# Patient Record
Sex: Female | Born: 1945 | Race: Black or African American | Hispanic: No | Marital: Single | Smoking: Former smoker
Health system: Southern US, Community
[De-identification: ages and names within clinical notes are randomized; demographics above are authoritative.]

## PROBLEM LIST (undated history)

## (undated) DIAGNOSIS — C801 Malignant (primary) neoplasm, unspecified: Secondary | ICD-10-CM

---

## 2015-04-14 ENCOUNTER — Other Ambulatory Visit: Payer: Self-pay

## 2015-04-14 ENCOUNTER — Inpatient Hospital Stay (HOSPITAL_COMMUNITY)
Admission: AD | Admit: 2015-04-14 | Discharge: 2015-04-22 | DRG: 853 | Disposition: A | Discharge status: Home Health | Payer: Commercial Managed Care - HMO | Source: Other Acute Inpatient Hospital | Attending: Internal Medicine | Admitting: Internal Medicine

## 2015-04-14 DIAGNOSIS — D490 Neoplasm of unspecified behavior of digestive system: Secondary | ICD-10-CM | POA: Diagnosis not present

## 2015-04-14 DIAGNOSIS — R296 Repeated falls: Secondary | ICD-10-CM | POA: Diagnosis not present

## 2015-04-14 DIAGNOSIS — R109 Unspecified abdominal pain: Secondary | ICD-10-CM | POA: Diagnosis present

## 2015-04-14 DIAGNOSIS — K831 Obstruction of bile duct: Secondary | ICD-10-CM | POA: Diagnosis present

## 2015-04-14 DIAGNOSIS — K921 Melena: Secondary | ICD-10-CM | POA: Diagnosis not present

## 2015-04-14 DIAGNOSIS — R64 Cachexia: Secondary | ICD-10-CM | POA: Diagnosis present

## 2015-04-14 DIAGNOSIS — E872 Acidosis: Secondary | ICD-10-CM | POA: Diagnosis not present

## 2015-04-14 DIAGNOSIS — I509 Heart failure, unspecified: Secondary | ICD-10-CM | POA: Diagnosis not present

## 2015-04-14 DIAGNOSIS — R17 Unspecified jaundice: Secondary | ICD-10-CM | POA: Insufficient documentation

## 2015-04-14 DIAGNOSIS — D62 Acute posthemorrhagic anemia: Secondary | ICD-10-CM | POA: Diagnosis present

## 2015-04-14 DIAGNOSIS — K922 Gastrointestinal hemorrhage, unspecified: Secondary | ICD-10-CM | POA: Diagnosis not present

## 2015-04-14 DIAGNOSIS — C179 Malignant neoplasm of small intestine, unspecified: Secondary | ICD-10-CM | POA: Diagnosis present

## 2015-04-14 DIAGNOSIS — Z6822 Body mass index (BMI) 22.0-22.9, adult: Secondary | ICD-10-CM | POA: Diagnosis not present

## 2015-04-14 DIAGNOSIS — E46 Unspecified protein-calorie malnutrition: Secondary | ICD-10-CM | POA: Diagnosis not present

## 2015-04-14 DIAGNOSIS — R1900 Intra-abdominal and pelvic swelling, mass and lump, unspecified site: Secondary | ICD-10-CM | POA: Diagnosis not present

## 2015-04-14 DIAGNOSIS — N179 Acute kidney failure, unspecified: Secondary | ICD-10-CM

## 2015-04-14 DIAGNOSIS — R6520 Severe sepsis without septic shock: Secondary | ICD-10-CM

## 2015-04-14 DIAGNOSIS — A419 Sepsis, unspecified organism: Principal | ICD-10-CM

## 2015-04-14 DIAGNOSIS — Z515 Encounter for palliative care: Secondary | ICD-10-CM | POA: Insufficient documentation

## 2015-04-14 DIAGNOSIS — Z66 Do not resuscitate: Secondary | ICD-10-CM | POA: Diagnosis not present

## 2015-04-14 DIAGNOSIS — K819 Cholecystitis, unspecified: Secondary | ICD-10-CM | POA: Diagnosis present

## 2015-04-14 DIAGNOSIS — R6521 Severe sepsis with septic shock: Secondary | ICD-10-CM | POA: Diagnosis present

## 2015-04-14 DIAGNOSIS — R531 Weakness: Secondary | ICD-10-CM | POA: Diagnosis not present

## 2015-04-14 DIAGNOSIS — D689 Coagulation defect, unspecified: Secondary | ICD-10-CM | POA: Diagnosis present

## 2015-04-14 DIAGNOSIS — N170 Acute kidney failure with tubular necrosis: Secondary | ICD-10-CM | POA: Diagnosis not present

## 2015-04-14 DIAGNOSIS — I471 Supraventricular tachycardia: Secondary | ICD-10-CM | POA: Diagnosis not present

## 2015-04-14 DIAGNOSIS — E876 Hypokalemia: Secondary | ICD-10-CM | POA: Diagnosis present

## 2015-04-14 DIAGNOSIS — K7290 Hepatic failure, unspecified without coma: Secondary | ICD-10-CM | POA: Diagnosis present

## 2015-04-14 DIAGNOSIS — T360X5A Adverse effect of penicillins, initial encounter: Secondary | ICD-10-CM | POA: Diagnosis not present

## 2015-04-14 DIAGNOSIS — N390 Urinary tract infection, site not specified: Secondary | ICD-10-CM | POA: Diagnosis not present

## 2015-04-14 DIAGNOSIS — J189 Pneumonia, unspecified organism: Secondary | ICD-10-CM

## 2015-04-14 DIAGNOSIS — C229 Malignant neoplasm of liver, not specified as primary or secondary: Secondary | ICD-10-CM | POA: Diagnosis not present

## 2015-04-14 DIAGNOSIS — C221 Intrahepatic bile duct carcinoma: Secondary | ICD-10-CM | POA: Insufficient documentation

## 2015-04-14 DIAGNOSIS — D684 Acquired coagulation factor deficiency: Secondary | ICD-10-CM | POA: Diagnosis not present

## 2015-04-14 NOTE — H&P (Signed)
PULMONARY / CRITICAL CARE MEDICINE   Name: Heather Harmon MRN: [id number] DOB: 1945-10-02    ADMISSION DATE:  04/14/2015 CONSULTATION DATE:  04/14/2015  REFERRING MD :  Oval Linsey EDP  CHIEF COMPLAINT:  Abd pain  INITIAL PRESENTATION: 69 year old female presented to [facility] ED c/o abdominal pain and yellow skin. 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 Also noted to be have AKI with SCr 8. Transferred to [facility] for further eval.  STUDIES:  CT abd/pelvis 7/20 > Significant intrahepatic and proximal extrahepatic biliary dilation and questionably obstructing mass lesion at the CBD concerning for cholangiocarcinoma. Contracted gallbladder containing high attenuation material. Enlarged perihepatic lymph node. US abdomen 7/20 > Results from [facility] not available at this time.  SIGNIFICANT EVENTS:  HISTORY OF PRESENT ILLNESS:  69 year old female with no known medical history reports "yellow eyes" for about one month now. About 3-4 days ago she began to have SOB with even minimal exertion which was progressively getting worse. 7/19 late PM she had several episodes of vomiting, and 7/20 she had weakness with multiple falls. She also had several loose stools that were noted to be bloody with clots by family. For these reasons she presented to the ER 7/20. Given her presentation she was sent for a CT scan of the abdomen and pelvis, the results of which are described above. 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 While in ED she developed sinus tachycardia with rates up to the 160s, which resolved with IV metoprolol. She had an abdominal ultrasound as well, however, those results are unavailable to me at this time. She was transferred to [facility] for further evaluation.   PAST MEDICAL HISTORY :   has no past medical history on file.  has no past  surgical history on file. Prior to Admission medications   Not on File   Allergies not on file  FAMILY HISTORY:  has no family status information on file.  SOCIAL HISTORY:    REVIEW OF SYSTEMS:  Bolds are positive  Constitutional: weight loss, gain, night sweats, Fevers, chills, fatigue .  HEENT: headaches, Sore throat, sneezing, nasal congestion, post nasal drip, Difficulty swallowing, Tooth/dental problems, visual complaints visual changes, ear ache CV:  chest pain, radiates: ,Orthopnea, PND, swelling in lower extremities, dizziness, palpitations, syncope.  GI  heartburn, indigestion, abdominal pain, nausea, vomiting, diarrhea, change in bowel habits, loss of appetite, bloody stools.  Resp: cough, productive: , hemoptysis, dyspnea, chest pain, pleuritic.  Skin: rash or itching or icterus GU: dysuria, change in color of urine, urgency or frequency. flank pain, hematuria  MS: joint pain or swelling. decreased range of motion  Psych: change in mood or affect. depression or anxiety.  Neuro: difficulty with speech, weakness, numbness, ataxia    SUBJECTIVE:   VITAL SIGNS: Pulse Rate:  [154] 154 (07/20 2300) Resp:  [20] 20 (07/20 2300) BP: (99)/(71) 99/71 mmHg (07/20 2300) SpO2:  [98 %] 98 % (07/20 2300) HEMODYNAMICS:   VENTILATOR SETTINGS:   INTAKE / OUTPUT: No intake or output data in the 24 hours ending 04/14/15 2331  PHYSICAL EXAMINATION: General:  Thin female in NAD Neuro:  Alert, oriented, non-focal HEENT:  [place]/AT, jaundiced sclerae, PERRL, no JVD Cardiovascular:  Tachy, regular, no MRG noted Lungs:  Diminished bases Abdomen: Soft, non-tender, non-distended Musculoskeletal:  No acute deformity or ROM limitation, no Edema Skin: Grossly intact, jaundiced.   LABS:  CBC No results  for input(s): WBC, HGB, HCT, PLT in the last 168 hours. Coag's No results for input(s): APTT, INR in the last 168 hours. BMET No results for input(s): NA, K, CL, CO2, BUN, CREATININE,  GLUCOSE in the last 168 hours. Electrolytes No results for input(s): CALCIUM, MG, PHOS in the last 168 hours. Sepsis Markers No results for input(s): LATICACIDVEN, PROCALCITON, O2SATVEN in the last 168 hours. ABG No results for input(s): PHART, PCO2ART, PO2ART in the last 168 hours. Liver Enzymes No results for input(s): AST, ALT, ALKPHOS, BILITOT, ALBUMIN in the last 168 hours. Cardiac Enzymes No results for input(s): TROPONINI, PROBNP in the last 168 hours. Glucose No results for input(s): GLUCAP in the last 168 hours.  Imaging No results found.   ASSESSMENT / PLAN:  PULMONARY A: Dyspnea ? 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 RLL PNA  P:   BCx2 7/21 >>> UC  7/21 >>>  Abx: pip/tazo, start date 7/21 >>> Add azithro if CAP on CXR PCT  ENDOCRINE A:   No acute issues  P:   Follow glucose on chemistry  NEUROLOGIC A:   No acute issues  P:   Monitor   FAMILY  - Updates:   - Inter-disciplinary family meet or Palliative Care meeting due by:  04/15/2015   Georgann Housekeeper,  AGACNP-BC [place] [facility] Pager [phone] or [phone]  04/15/2015 12:27 AM

## 2015-04-15 ENCOUNTER — Inpatient Hospital Stay (HOSPITAL_COMMUNITY): Payer: Commercial Managed Care - HMO

## 2015-04-15 ENCOUNTER — Encounter (HOSPITAL_COMMUNITY): Payer: Self-pay | Admitting: Critical Care Medicine

## 2015-04-15 DIAGNOSIS — N179 Acute kidney failure, unspecified: Secondary | ICD-10-CM

## 2015-04-15 DIAGNOSIS — K831 Obstruction of bile duct: Secondary | ICD-10-CM

## 2015-04-15 DIAGNOSIS — R6520 Severe sepsis without septic shock: Secondary | ICD-10-CM

## 2015-04-15 DIAGNOSIS — E876 Hypokalemia: Secondary | ICD-10-CM | POA: Diagnosis present

## 2015-04-15 DIAGNOSIS — N390 Urinary tract infection, site not specified: Secondary | ICD-10-CM

## 2015-04-15 DIAGNOSIS — R1900 Intra-abdominal and pelvic swelling, mass and lump, unspecified site: Secondary | ICD-10-CM

## 2015-04-15 DIAGNOSIS — A419 Sepsis, unspecified organism: Secondary | ICD-10-CM

## 2015-04-15 LAB — CBC
HCT: 19.6 % — ABNORMAL LOW (ref 36.0–46.0)
HCT: 22.7 % — ABNORMAL LOW (ref 36.0–46.0)
HEMATOCRIT: 25.7 % — AB (ref 36.0–46.0)
HEMOGLOBIN: 8.5 g/dL — AB (ref 12.0–15.0)
HEMOGLOBIN: 6.6 g/dL — AB (ref 12.0–15.0)
Hemoglobin: 7.8 g/dL — ABNORMAL LOW (ref 12.0–15.0)
MCH: 27.2 pg (ref 26.0–34.0)
MCH: 28.3 pg (ref 26.0–34.0)
MCH: 29.9 pg (ref 26.0–34.0)
MCHC: 33.1 g/dL (ref 30.0–36.0)
MCHC: 33.7 g/dL (ref 30.0–36.0)
MCHC: 34.4 g/dL (ref 30.0–36.0)
MCV: 82.4 fL (ref 78.0–100.0)
MCV: 84.1 fL (ref 78.0–100.0)
MCV: 87 fL (ref 78.0–100.0)
PLATELETS: 337 10*3/uL (ref 150–400)
Platelets: 288 10*3/uL (ref 150–400)
Platelets: 274 10*3/uL (ref 150–400)
RBC: 3.12 MIL/uL — AB (ref 3.87–5.11)
RBC: 2.33 MIL/uL — ABNORMAL LOW (ref 3.87–5.11)
RBC: 2.61 MIL/uL — AB (ref 3.87–5.11)
RDW: 17.4 % — ABNORMAL HIGH (ref 11.5–15.5)
RDW: 17.1 % — ABNORMAL HIGH (ref 11.5–15.5)
RDW: 16.9 % — ABNORMAL HIGH (ref 11.5–15.5)
WBC: 17.6 10*3/uL — ABNORMAL HIGH (ref 4.0–10.5)
WBC: 19.3 10*3/uL — ABNORMAL HIGH (ref 4.0–10.5)
WBC: 16.5 10*3/uL — ABNORMAL HIGH (ref 4.0–10.5)

## 2015-04-15 LAB — URINALYSIS, ROUTINE W REFLEX MICROSCOPIC
Glucose, UA: NEGATIVE mg/dL
Ketones, ur: NEGATIVE mg/dL
NITRITE: NEGATIVE
Protein, ur: NEGATIVE mg/dL
Specific Gravity, Urine: 1.006 (ref 1.005–1.030)
UROBILINOGEN UA: 0.2 mg/dL (ref 0.0–1.0)
pH: 6 (ref 5.0–8.0)

## 2015-04-15 LAB — COMPREHENSIVE METABOLIC PANEL
ALBUMIN: 1.7 g/dL — AB (ref 3.5–5.0)
ALT: 79 U/L — AB (ref 14–54)
AST: 114 U/L — ABNORMAL HIGH (ref 15–41)
Alkaline Phosphatase: 682 U/L — ABNORMAL HIGH (ref 38–126)
Anion gap: 17 — ABNORMAL HIGH (ref 5–15)
BUN: 100 mg/dL — AB (ref 6–20)
CO2: 11 mmol/L — ABNORMAL LOW (ref 22–32)
CREATININE: 7.44 mg/dL — AB (ref 0.44–1.00)
Calcium: 7.8 mg/dL — ABNORMAL LOW (ref 8.9–10.3)
Chloride: 102 mmol/L (ref 101–111)
GFR calc Af Amer: 6 mL/min — ABNORMAL LOW (ref >60)
GFR calc non Af Amer: 5 mL/min — ABNORMAL LOW (ref >60)
GLUCOSE: 99 mg/dL (ref 65–99)
Potassium: 3.2 mmol/L — ABNORMAL LOW (ref 3.5–5.1)
SODIUM: 130 mmol/L — AB (ref 135–145)
TOTAL PROTEIN: 5.3 g/dL — AB (ref 6.5–8.1)
Total Bilirubin: 40.8 mg/dL (ref 0.3–1.2)

## 2015-04-15 LAB — TROPONIN I
TROPONIN I: 0.1 ng/mL — AB (ref <0.031)
TROPONIN I: 0.05 ng/mL — AB (ref <0.031)
Troponin I: 0.05 ng/mL — ABNORMAL HIGH (ref <0.031)

## 2015-04-15 LAB — LACTIC ACID, PLASMA: LACTIC ACID, VENOUS: 1 mmol/L (ref 0.5–2.0)

## 2015-04-15 LAB — MAGNESIUM
MAGNESIUM: 1.6 mg/dL — AB (ref 1.7–2.4)
Magnesium: 1.2 mg/dL — ABNORMAL LOW (ref 1.7–2.4)

## 2015-04-15 LAB — PHOSPHORUS
Phosphorus: 7.1 mg/dL — ABNORMAL HIGH (ref 2.5–4.6)
Phosphorus: 6.4 mg/dL — ABNORMAL HIGH (ref 2.5–4.6)

## 2015-04-15 LAB — BASIC METABOLIC PANEL
ANION GAP: 19 — AB (ref 5–15)
Anion gap: 14 (ref 5–15)
BUN: 94 mg/dL — AB (ref 6–20)
BUN: 82 mg/dL — AB (ref 6–20)
CALCIUM: 8.1 mg/dL — AB (ref 8.9–10.3)
CHLORIDE: 109 mmol/L (ref 101–111)
CO2: 12 mmol/L — ABNORMAL LOW (ref 22–32)
CO2: 11 mmol/L — ABNORMAL LOW (ref 22–32)
Calcium: 7.7 mg/dL — ABNORMAL LOW (ref 8.9–10.3)
Chloride: 108 mmol/L (ref 101–111)
Creatinine, Ser: 6.32 mg/dL — ABNORMAL HIGH (ref 0.44–1.00)
Creatinine, Ser: 6.24 mg/dL — ABNORMAL HIGH (ref 0.44–1.00)
GFR calc Af Amer: 7 mL/min — ABNORMAL LOW (ref >60)
GFR calc non Af Amer: 6 mL/min — ABNORMAL LOW (ref >60)
GFR calc non Af Amer: 6 mL/min — ABNORMAL LOW (ref >60)
GFR, EST AFRICAN AMERICAN: 7 mL/min — AB (ref >60)
GLUCOSE: 86 mg/dL (ref 65–99)
Glucose, Bld: 85 mg/dL (ref 65–99)
Potassium: 3.2 mmol/L — ABNORMAL LOW (ref 3.5–5.1)
Potassium: 2.8 mmol/L — ABNORMAL LOW (ref 3.5–5.1)
SODIUM: 135 mmol/L (ref 135–145)
Sodium: 138 mmol/L (ref 135–145)

## 2015-04-15 LAB — PREPARE RBC (CROSSMATCH)

## 2015-04-15 LAB — FIBRINOGEN: FIBRINOGEN: 508 mg/dL — AB (ref 204–475)

## 2015-04-15 LAB — PROTIME-INR
INR: 5.78 (ref 0.00–1.49)
INR: 1.23 (ref 0.00–1.49)
Prothrombin Time: 50.1 seconds — ABNORMAL HIGH (ref 11.6–15.2)
Prothrombin Time: 15.6 seconds — ABNORMAL HIGH (ref 11.6–15.2)

## 2015-04-15 LAB — LIPASE, BLOOD: Lipase: 18 U/L — ABNORMAL LOW (ref 22–51)

## 2015-04-15 LAB — URINE MICROSCOPIC-ADD ON

## 2015-04-15 LAB — MRSA PCR SCREENING: MRSA by PCR: NEGATIVE

## 2015-04-15 LAB — BRAIN NATRIURETIC PEPTIDE: B Natriuretic Peptide: 121.2 pg/mL — ABNORMAL HIGH (ref 0.0–100.0)

## 2015-04-15 LAB — APTT: aPTT: 26 seconds (ref 24–37)

## 2015-04-15 LAB — ABO/RH: ABO/RH(D): B POS

## 2015-04-15 LAB — AMYLASE: AMYLASE: 27 U/L — AB (ref 28–100)

## 2015-04-15 MED ORDER — POTASSIUM CHLORIDE 10 MEQ/100ML IV SOLN
10.0000 meq | INTRAVENOUS | Status: AC
  Administered 2015-04-15 (×4): 10 meq via INTRAVENOUS
  Filled 2015-04-15 (×4): qty 100

## 2015-04-15 MED ORDER — SODIUM CHLORIDE 0.9 % IV SOLN: Freq: Once | INTRAVENOUS | Status: DC

## 2015-04-15 MED ORDER — METOPROLOL TARTRATE 1 MG/ML IV SOLN: 2.5000 mg | INTRAVENOUS | Status: DC | PRN

## 2015-04-15 MED ORDER — SODIUM CHLORIDE 0.9 % IV SOLN
INTRAVENOUS | Status: DC
  Administered 2015-04-15: 100 mL via INTRAVENOUS

## 2015-04-15 MED ORDER — POTASSIUM CHLORIDE 10 MEQ/100ML IV SOLN
10.0000 meq | INTRAVENOUS | Status: DC
  Filled 2015-04-15: qty 100

## 2015-04-15 MED ORDER — METOPROLOL TARTRATE 1 MG/ML IV SOLN
2.5000 mg | INTRAVENOUS | Status: DC | PRN
  Administered 2015-04-15 (×2): 2.5 mg via INTRAVENOUS
  Filled 2015-04-15 (×2): qty 5

## 2015-04-15 MED ORDER — SODIUM CHLORIDE 0.9 % IV SOLN: 250.0000 mL | INTRAVENOUS | Status: DC | PRN

## 2015-04-15 MED ORDER — POTASSIUM CHLORIDE 10 MEQ/50ML IV SOLN
10.0000 meq | INTRAVENOUS | Status: DC
Start: 2015-04-15 — End: 2015-04-15
  Administered 2015-04-15 (×3): 10 meq via INTRAVENOUS

## 2015-04-15 MED ORDER — FAMOTIDINE IN NACL 20-0.9 MG/50ML-% IV SOLN: 20.0000 mg | INTRAVENOUS | Status: DC

## 2015-04-15 MED ORDER — PIPERACILLIN-TAZOBACTAM IN DEX 2-0.25 GM/50ML IV SOLN
2.2500 g | Freq: Once | INTRAVENOUS | Status: AC
  Administered 2015-04-15: 2.25 g via INTRAVENOUS
  Filled 2015-04-15: qty 50

## 2015-04-15 MED ORDER — FAMOTIDINE IN NACL 20-0.9 MG/50ML-% IV SOLN
20.0000 mg | Freq: Two times a day (BID) | INTRAVENOUS | Status: DC
  Administered 2015-04-15: 20 mg via INTRAVENOUS
  Filled 2015-04-15: qty 50

## 2015-04-15 MED ORDER — MAGNESIUM SULFATE IN D5W 10-5 MG/ML-% IV SOLN
1.0000 g | Freq: Once | INTRAVENOUS | Status: AC
  Administered 2015-04-15: 1 g via INTRAVENOUS
  Filled 2015-04-15: qty 100

## 2015-04-15 MED ORDER — PANTOPRAZOLE SODIUM 40 MG IV SOLR
40.0000 mg | Freq: Two times a day (BID) | INTRAVENOUS | Status: DC
  Administered 2015-04-15 – 2015-04-16 (×4): 40 mg via INTRAVENOUS
  Filled 2015-04-15 (×7): qty 40

## 2015-04-15 MED ORDER — SODIUM BICARBONATE 8.4 % IV SOLN
INTRAVENOUS | Status: DC
  Administered 2015-04-15: 12:00:00 via INTRAVENOUS
  Filled 2015-04-15 (×6): qty 150

## 2015-04-15 MED ORDER — VITAMIN K1 10 MG/ML IJ SOLN
10.0000 mg | Freq: Once | INTRAVENOUS | Status: AC
  Administered 2015-04-15: 10 mg via INTRAVENOUS
  Filled 2015-04-15: qty 1

## 2015-04-15 MED ORDER — PIPERACILLIN-TAZOBACTAM IN DEX 2-0.25 GM/50ML IV SOLN
2.2500 g | Freq: Three times a day (TID) | INTRAVENOUS | Status: DC
  Administered 2015-04-15 – 2015-04-17 (×7): 2.25 g via INTRAVENOUS
  Filled 2015-04-15 (×8): qty 50

## 2015-04-15 MED ORDER — SODIUM CHLORIDE 0.9 % IV BOLUS (SEPSIS)
750.0000 mL | Freq: Once | INTRAVENOUS | Status: AC
  Administered 2015-04-15 (×2): 750 mL via INTRAVENOUS

## 2015-04-15 NOTE — Progress Notes (Signed)
CRITICAL VALUE ALERT  Critical value received:  Total Bilirubin 40.8, INR 5.78   Date of notification:  7-21  Time of notification:  0300  Critical value read back: yes  Nurse who received alert:  Patricia Nettle  MD notified (1st page):  MD Joya Gaskins  Time of first page:  0340  MD notified (2nd page):  Time of second page:  Responding MD:  MD Joya Gaskins  Time MD responded:  [phone]

## 2015-04-15 NOTE — Progress Notes (Signed)
[place] Progress Note Patient Name: Heather Harmon DOB: 1946/06/27 MRN: [id number]   Date of Service  04/15/2015  HPI/Events of Note  Ongoing tachycardia with HR in the 140s.  Has received 2 doses of 2.5 mg of lopressor IV without change.  eICU Interventions  Dosing of lopressor changed to 2.5 to 5 mg to see is a higher dose will control HR     Intervention Category Intermediate Interventions: Arrhythmia - evaluation and management  Hyden Soley 04/15/2015, 11:46 PM

## 2015-04-15 NOTE — Progress Notes (Signed)
[place] Progress Note Patient Name: Heather Harmon DOB: 25-May-1946 MRN: [id number]   Date of Service  04/15/2015  HPI/Events of Note  Patient w/ persistent tachycardia into 140s while awake. Sinus tach on EKG. Patient denies any palpitations, SOB, or chest discomfort. BP stable. RN reports HR <100 when sleeping.  eICU Interventions  Spoke with RN at bedside. Continue IV Lopressor prn.     Intervention Category Minor Interventions: Clinical assessment - ordering diagnostic tests  Tera Partridge 04/15/2015, 7:07 PM

## 2015-04-15 NOTE — Consult Note (Signed)
Reason for Consult: AKI Referring Physician: PCCM  HPI: Heather Harmon is a 69 yo female with no diagnosed PMHx and not on any home medications. Patient has had a one month history of yellowing of her eyes. In the last 3-4 days, patient began to develop shortness of breath with exertion, nausea with vomting, weakness and began having multiple falls at home. Family reports patient started having bloody stools and she was brought to [facility] ED. There, patient complained mainly of abdominal pain and yellowing of skin and eyes. She was found to be tachycardic with leukocytosis, anemic with hemoglobin of 6.6, coagulopathic with INR of 5.7 and prothrombin time 50 and elevated liver enzymes. CT abdomen/pelvis revealed a contracted gallbladder and a mass on CBD concerning for cholangiocarcinoma, possibly [place] tumor. Patient was transferred to [facility] for further evaluation and treatment. Patient was started on Zosyn, protonix, vitamin K, FFP, and transfused with pRBCs.   Nephrology was consulted for likely acute on chronic kidney disease. We do not have a known baseline. SCr 7.44>6.32. GFR of 5-7.   Patient was seen and examined. She states she feels well and denies any complaints.   History reviewed. No pertinent past medical history.  History reviewed. No pertinent past surgical history.  History reviewed. No pertinent family history.  Social History:  has no tobacco, alcohol, and drug history on file.  Allergies: No Known Allergies   Results for orders placed or performed during the hospital encounter of 04/14/15 (from the past 48 hour(s))  MRSA PCR Screening     Status: None   Collection Time: 04/14/15 10:56 PM  Result Value Ref Range   MRSA by PCR NEGATIVE NEGATIVE    Comment:        The GeneXpert MRSA Assay (FDA approved for NASAL specimens only), is one component of a comprehensive MRSA colonization surveillance program. It is not intended to diagnose MRSA infection nor to guide  or monitor treatment for MRSA infections.   Urinalysis, Routine w reflex microscopic (not at [facility])     Status: Abnormal   Collection Time: 04/15/15 12:48 AM  Result Value Ref Range   Color, Urine AMBER (A) YELLOW    Comment: BIOCHEMICALS MAY BE AFFECTED BY COLOR   APPearance CLOUDY (A) CLEAR   Specific Gravity, Urine 1.006 1.005 - 1.030   pH 6.0 5.0 - 8.0   Glucose, UA NEGATIVE NEGATIVE mg/dL   Hgb urine dipstick MODERATE (A) NEGATIVE   Bilirubin Urine LARGE (A) NEGATIVE   Ketones, ur NEGATIVE NEGATIVE mg/dL   Protein, ur NEGATIVE NEGATIVE mg/dL   Urobilinogen, UA 0.2 0.0 - 1.0 mg/dL   Nitrite NEGATIVE NEGATIVE   Leukocytes, UA SMALL (A) NEGATIVE  Urine microscopic-add on     Status: None   Collection Time: 04/15/15 12:48 AM  Result Value Ref Range   Squamous Epithelial / LPF RARE RARE   WBC, UA 3-6 <3 WBC/hpf   RBC / HPF 3-6 <3 RBC/hpf   Bacteria, UA RARE RARE  Comprehensive metabolic panel     Status: Abnormal   Collection Time: 04/15/15  1:26 AM  Result Value Ref Range   Sodium 130 (L) 135 - 145 mmol/L   Potassium 3.2 (L) 3.5 - 5.1 mmol/L   Chloride 102 101 - 111 mmol/L   CO2 11 (L) 22 - 32 mmol/L   Glucose, Bld 99 65 - 99 mg/dL   BUN 100 (H) 6 - 20 mg/dL   Creatinine, Ser 7.44 (H) 0.44 - 1.00 mg/dL   Calcium 7.8 (  L) 8.9 - 10.3 mg/dL   Total Protein 5.3 (L) 6.5 - 8.1 g/dL   Albumin 1.7 (L) 3.5 - 5.0 g/dL   AST 114 (H) 15 - 41 U/L   ALT 79 (H) 14 - 54 U/L   Alkaline Phosphatase 682 (H) 38 - 126 U/L   Total Bilirubin 40.8 (HH) 0.3 - 1.2 mg/dL    Comment: REPEATED TO VERIFY RESULTS CONFIRMED BY MANUAL DILUTION CRITICAL RESULT CALLED TO, READ BACK BY AND VERIFIED WITH: SMITH F,RN 04/15/15 0311 WAYK    GFR calc non Af Amer 5 (L) >60 mL/min   GFR calc Af Amer 6 (L) >60 mL/min    Comment: (NOTE) The eGFR has been calculated using the CKD EPI equation. This calculation has not been validated in all clinical situations. eGFR's persistently <60 mL/min signify possible  Chronic Kidney Disease.    Anion gap 17 (H) 5 - 15  Magnesium     Status: Abnormal   Collection Time: 04/15/15  1:26 AM  Result Value Ref Range   Magnesium 1.2 (L) 1.7 - 2.4 mg/dL  Phosphorus     Status: Abnormal   Collection Time: 04/15/15  1:26 AM  Result Value Ref Range   Phosphorus 7.1 (H) 2.5 - 4.6 mg/dL  Amylase     Status: Abnormal   Collection Time: 04/15/15  1:26 AM  Result Value Ref Range   Amylase 27 (L) 28 - 100 U/L  Lipase, blood     Status: Abnormal   Collection Time: 04/15/15  1:26 AM  Result Value Ref Range   Lipase 18 (L) 22 - 51 U/L  Troponin I     Status: Abnormal   Collection Time: 04/15/15  1:26 AM  Result Value Ref Range   Troponin I 0.10 (H) <0.031 ng/mL    Comment:        PERSISTENTLY INCREASED TROPONIN VALUES IN THE RANGE OF 0.04-0.49 ng/mL CAN BE SEEN IN:       -UNSTABLE ANGINA       -CONGESTIVE HEART FAILURE       -MYOCARDITIS       -CHEST TRAUMA       -ARRYHTHMIAS       -LATE PRESENTING MYOCARDIAL INFARCTION       -COPD   CLINICAL FOLLOW-UP RECOMMENDED.   Lactic acid, plasma     Status: None   Collection Time: 04/15/15  1:26 AM  Result Value Ref Range   Lactic Acid, Venous 1.0 0.5 - 2.0 mmol/L  Procalcitonin     Status: None   Collection Time: 04/15/15  1:26 AM  Result Value Ref Range   Procalcitonin 5.65 ng/mL    Comment:        Interpretation: PCT > 2 ng/mL: Systemic infection (sepsis) is likely, unless other causes are known. (NOTE)         ICU PCT Algorithm               Non ICU PCT Algorithm    ----------------------------     ------------------------------         PCT < 0.25 ng/mL                 PCT < 0.1 ng/mL     Stopping of antibiotics            Stopping of antibiotics       strongly encouraged.               strongly encouraged.    ----------------------------     ------------------------------  PCT level decrease by               PCT < 0.25 ng/mL       >= 80% from peak PCT       OR PCT 0.25 - 0.5 ng/mL           Stopping of antibiotics                                             encouraged.     Stopping of antibiotics           encouraged.    ----------------------------     ------------------------------       PCT level decrease by              PCT >= 0.25 ng/mL       < 80% from peak PCT        AND PCT >= 0.5 ng/mL            Continuing antibiotics                                               encouraged.       Continuing antibiotics            encouraged.    ----------------------------     ------------------------------     PCT level increase compared          PCT > 0.5 ng/mL         with peak PCT AND          PCT >= 0.5 ng/mL             Escalation of antibiotics                                          strongly encouraged.      Escalation of antibiotics        strongly encouraged.   Brain natriuretic peptide     Status: Abnormal   Collection Time: 04/15/15  1:26 AM  Result Value Ref Range   B Natriuretic Peptide 121.2 (H) 0.0 - 100.0 pg/mL  CBC     Status: Abnormal   Collection Time: 04/15/15  1:26 AM  Result Value Ref Range   WBC 17.6 (H) 4.0 - 10.5 K/uL   RBC 3.12 (L) 3.87 - 5.11 MIL/uL   Hemoglobin 8.5 (L) 12.0 - 15.0 g/dL   HCT 25.7 (L) 36.0 - 46.0 %   MCV 82.4 78.0 - 100.0 fL   MCH 27.2 26.0 - 34.0 pg   MCHC 33.1 30.0 - 36.0 g/dL   RDW 17.4 (H) 11.5 - 15.5 %   Platelets 337 150 - 400 K/uL  Protime-INR     Status: Abnormal   Collection Time: 04/15/15  1:26 AM  Result Value Ref Range   Prothrombin Time 50.1 (H) 11.6 - 15.2 seconds    Comment: REPEATED TO VERIFY   INR 5.78 (HH) 0.00 - 1.49    Comment: REPEATED TO VERIFY CRITICAL RESULT CALLED TO, READ BACK BY AND VERIFIED WITH: S LOVE,RN 0240 04/15/15 D BRADLEY   Type and screen  Status: None (Preliminary result)   Collection Time: 04/15/15  1:26 AM  Result Value Ref Range   ABO/RH(D) B POS    Antibody Screen NEG    Sample Expiration 04/18/2015    Unit Number [id number]    Blood Component Type RED CELLS,LR     Unit division 00    Status of Unit ISSUED    Transfusion Status OK TO TRANSFUSE    Crossmatch Result Compatible    Unit Number [id number]    Blood Component Type RED CELLS,LR    Unit division 00    Status of Unit ALLOCATED    Transfusion Status OK TO TRANSFUSE    Crossmatch Result Compatible   ABO/Rh     Status: None   Collection Time: 04/15/15  1:26 AM  Result Value Ref Range   ABO/RH(D) B POS   Prepare fresh frozen plasma     Status: None (Preliminary result)   Collection Time: 04/15/15  1:58 AM  Result Value Ref Range   Unit Number [id number]    Blood Component Type THAWED PLASMA    Unit division 00    Status of Unit ISSUED    Transfusion Status OK TO TRANSFUSE    Unit Number [id number]    Blood Component Type THAWED PLASMA    Unit division 00    Status of Unit ISSUED    Transfusion Status OK TO TRANSFUSE   Troponin I     Status: Abnormal   Collection Time: 04/15/15  7:50 AM  Result Value Ref Range   Troponin I 0.05 (H) <0.031 ng/mL    Comment:        PERSISTENTLY INCREASED TROPONIN VALUES IN THE RANGE OF 0.04-0.49 ng/mL CAN BE SEEN IN:       -UNSTABLE ANGINA       -CONGESTIVE HEART FAILURE       -MYOCARDITIS       -CHEST TRAUMA       -ARRYHTHMIAS       -LATE PRESENTING MYOCARDIAL INFARCTION       -COPD   CLINICAL FOLLOW-UP RECOMMENDED.   CBC     Status: Abnormal   Collection Time: 04/15/15  7:50 AM  Result Value Ref Range   WBC 19.3 (H) 4.0 - 10.5 K/uL   RBC 2.33 (L) 3.87 - 5.11 MIL/uL   Hemoglobin 6.6 (LL) 12.0 - 15.0 g/dL    Comment: REPEATED TO VERIFY CRITICAL RESULT CALLED TO, READ BACK BY AND VERIFIED WITH: T PARKER,RN AT 0263 04/15/15 BY K BARR    HCT 19.6 (L) 36.0 - 46.0 %   MCV 84.1 78.0 - 100.0 fL   MCH 28.3 26.0 - 34.0 pg   MCHC 33.7 30.0 - 36.0 g/dL   RDW 17.1 (H) 11.5 - 15.5 %   Platelets 288 150 - 400 K/uL  Basic metabolic panel     Status: Abnormal   Collection Time: 04/15/15  7:50 AM  Result Value Ref Range   Sodium 135 135 -  145 mmol/L   Potassium 3.2 (L) 3.5 - 5.1 mmol/L   Chloride 109 101 - 111 mmol/L   CO2 12 (L) 22 - 32 mmol/L   Glucose, Bld 86 65 - 99 mg/dL   BUN 94 (H) 6 - 20 mg/dL   Creatinine, Ser 6.32 (H) 0.44 - 1.00 mg/dL   Calcium 7.7 (L) 8.9 - 10.3 mg/dL   GFR calc non Af Amer 6 (L) >60 mL/min   GFR calc Af Amer 7 (L) >60 mL/min  Comment: (NOTE) The eGFR has been calculated using the CKD EPI equation. This calculation has not been validated in all clinical situations. eGFR's persistently <60 mL/min signify possible Chronic Kidney Disease.    Anion gap 14 5 - 15  Magnesium     Status: Abnormal   Collection Time: 04/15/15  7:50 AM  Result Value Ref Range   Magnesium 1.6 (L) 1.7 - 2.4 mg/dL  Phosphorus     Status: Abnormal   Collection Time: 04/15/15  7:50 AM  Result Value Ref Range   Phosphorus 6.4 (H) 2.5 - 4.6 mg/dL  Prepare fresh frozen plasma     Status: None (Preliminary result)   Collection Time: 04/15/15  8:02 AM  Result Value Ref Range   Unit Number [id number]    Blood Component Type THAWED PLASMA    Unit division 00    Status of Unit ISSUED    Transfusion Status OK TO TRANSFUSE    Unit Number [id number]    Blood Component Type THAWED PLASMA    Unit division 00    Status of Unit ISSUED    Transfusion Status OK TO TRANSFUSE    Unit Number [id number]    Blood Component Type THAWED PLASMA    Unit division 00    Status of Unit ISSUED    Transfusion Status OK TO TRANSFUSE    Unit Number [id number]    Blood Component Type THAWED PLASMA    Unit division 00    Status of Unit ISSUED    Transfusion Status OK TO TRANSFUSE   Prepare RBC     Status: None   Collection Time: 04/15/15  9:37 AM  Result Value Ref Range   Order Confirmation ORDER PROCESSED BY BLOOD BANK   Troponin I     Status: Abnormal   Collection Time: 04/15/15  1:30 PM  Result Value Ref Range   Troponin I 0.05 (H) <0.031 ng/mL    Comment:        PERSISTENTLY INCREASED TROPONIN VALUES IN THE RANGE  OF 0.04-0.49 ng/mL CAN BE SEEN IN:       -UNSTABLE ANGINA       -CONGESTIVE HEART FAILURE       -MYOCARDITIS       -CHEST TRAUMA       -ARRYHTHMIAS       -LATE PRESENTING MYOCARDIAL INFARCTION       -COPD   CLINICAL FOLLOW-UP RECOMMENDED.   Protime-INR Once     Status: Abnormal   Collection Time: 04/15/15  1:30 PM  Result Value Ref Range   Prothrombin Time 15.6 (H) 11.6 - 15.2 seconds   INR 1.23 0.00 - 1.49    Dg Chest Port 1 View  04/15/2015   CLINICAL DATA:  Acute onset of generalized shortness of breath and weakness. Initial encounter.  EXAM: PORTABLE CHEST - 1 VIEW  COMPARISON:  None.  FINDINGS: The lungs are well-aerated. Minimal right basilar density is thought to reflect overlying soft tissues. There is no evidence of focal opacification, pleural effusion or pneumothorax.  The cardiomediastinal silhouette is within normal limits. No acute osseous abnormalities are seen.  IMPRESSION: No acute cardiopulmonary process seen.   Electronically Signed   By: Garald Balding M.D.   On: 04/15/2015 01:51   ROS  General: Denies fever, chills Respiratory: Denies SOB, cough Cardiovascular: Denies chest pain and palpitations.  Gastrointestinal: Admits to nausea, vomiting, abdominal pain Genitourinary: Denies dysuria, urgency.  Skin: Admits to yellowing  Neurological: Denies dizziness, headaches, weakness, lightheadedness  Physical Exam Filed Vitals:   04/15/15 1435 04/15/15 1500 04/15/15 1530 04/15/15 1535  BP:  101/70 102/57 99/61  Pulse:   87 89  Temp:    98.1 F (36.7 C)  TempSrc:    Oral  Resp: 19 20 22 20   Height:      Weight:      SpO2:  100% 100% 96%   General: Vital signs reviewed.  Patient is chronically ill-appearing, in no acute distress and cooperative with exam.  Eyes: +Scleral icterus Cardiovascular: Tachycardic, regular rhythm, S1 normal, S2 normal Pulmonary/Chest: Clear to auscultation bilaterally, no wheezes, rales, or rhonchi. Abdominal: Soft, non-tender,  non-distended, BS +, no guarding present.  Extremities: No lower extremity edema bilaterally Neurological: A&O Skin: Jaundice. Warm, dry and intact.   Assessment/Plan:  Acute Kidney Injury: Likely secondary to Acute Tubular Necrosis due to hypoperfusion related to septic shock and liver disease. Differential diagnosis includes hepatorenal syndrome, but since patient has already begun to improve, this diagnosis is less likely. We do not have a known baseline GFR. Patient does not follow with a physician and may have underlying chronic disease. SCr 7.44>6.32. GFR of 6-7. There is no indication for CRRT at this time. We will treat with fluids, bicarb and monitor UOP for now.  -Trend CMET -Monitor UOP -Place foley -Sodium Bicarbonate 150 mEq in D5W at 75 cc/hr  Hypokalemia: Potassium 3.2. Likely will increase with administration of blood products. May decrease in setting of bicarb. Monitor for now. -Repeat CMET tomorrow am  Hyperphosphatemia: Phosphorus 7.1>6.4. -Monitor  Normocytic Anemia in setting of ?UGIB: Family reports bloody stools. Hgb 8.5>6.6, partially dilutional effect. Unknown baseline. Patient may have had erosion of the tumor into into surround vessels causing bleed.  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 Patient with elevated LFTs (alk phos 682, AST 114, ALT 79, T. Bili 40.8. GI and Surgery following. GI will schedule for EGD tomorrow with biopsy and possible ERCP with stent placement. IR may need to see if unable to place stent. Surgery has also seen the patient for possible surgery. Depending on location of tumor, patient may need to be transferred to another facility.  -Protonix -Zosyn -PCCM, GI and Surgery following  Coagulopathy: Likely from underlying intrinsic disease. Prothrombin time 50.1, INR 5.78. Patient received FFP x 4 and Vitamin K.  -Vitamin  K -Monitor -Repeat CMET tomorrow am  -Check HIV, Hepatitis B surface antibody and antigen, hepatitis c antibody  [name], DO PGY-2 Internal Medicine Resident Pager # [phone] 04/15/2015 3:44 PM

## 2015-04-15 NOTE — Progress Notes (Signed)
eLink Physician-Brief Progress Note Patient Name: Heather Harmon DOB: 1945-09-28 MRN: [id number]   Date of Service  04/15/2015  HPI/Events of Note  RN notified that K on BMP was 2.8.   eICU Interventions  IVK     Intervention Category Intermediate Interventions: Electrolyte abnormality - evaluation and management  Tera Partridge 04/15/2015, 7:42 PM

## 2015-04-15 NOTE — Progress Notes (Signed)
[place] Progress Note Patient Name: Heather Harmon DOB: [birth year] MRN: [id number]   Date of Service  04/15/2015  HPI/Events of Note  Septic shock state  eICU Interventions  Give ns bolus, chk lactate, may need cvl     Intervention Category Major Interventions: Shock - evaluation and management;Sepsis - evaluation and management  Asencion Noble 04/15/2015, 1:34 AM

## 2015-04-15 NOTE — Consult Note (Signed)
[facility] Consult: 9:08 AM 04/15/2015  LOS: 1 day    Referring Provider: Dr Elsworth Soho  Primary Care Physician:  Marco Collie, MD: family says pt has not been to see an MD in >40 years Primary Gastroenterologist:  unassigned  Sister is Alveda Reasons [phone]   Reason for Consultation:  Jaundice, CBD mass.    HPI: Heather Harmon is a 69 y.o. female.  No previous hx.  Never sees an MD.  No regular meds.    Scleral icterus for about 1 month along with anorexia.  4 days of progressive SOB. Vomiting (non bloody) started 7/19. Several falls and passing clotted, dark bloody stool x 1 on 7/20. No BM since.   Transfer from [facility] with jaundice, t bil 40.  CT there with contracted GB with high attenuation material, dilated biliary tree, ? 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  Coagulopathic at 50/5.7.  Received FFPand vitamin K.  Empiric Zosyn and IV pepcid initiated.   She is also anemic with Hgb of 6.6, normal MCV. Hypokalemic, AKI.       History reviewed. No pertinent past medical history.  History reviewed. No pertinent past surgical history.  Prior to Admission medications   Not on File    Scheduled Meds: . sodium chloride   Intravenous Once  . sodium chloride   Intravenous Once  . famotidine (PEPCID) IV  20 mg Intravenous Q12H  . piperacillin-tazobactam (ZOSYN)  IV  2.25 g Intravenous Q8H   Infusions: . sodium chloride 100 mL (04/15/15 0154)   PRN Meds: sodium chloride, metoprolol   Allergies as of 04/14/2015  . (Not on File)    History reviewed. No pertinent family history.  History   Social History  . Marital Status: Single    Spouse Name: N/A  . Number of Children: N/A  . Years of Education: N/A   Occupational History  . Not on file.    Social History Main Topics  . Smoking status: Not on file  . Smokeless tobacco: Not on file  . Alcohol Use: Not on file  . Drug Use: Not on file  . Sexual Activity: Not on file   Other Topics Concern  . Not on file   Social History Narrative  . No narrative on file    REVIEW OF SYSTEMS: Constitutional:  Pt denies but her sister endorses weight loss.   ENT:  No nose bleeds Pulm:  + DOE CV:  No palpitations, no LE edema.  GU:  No hematuria, no frequency.  Urine amber colored GI:  No dysphagia, no abd pain.  Daily BMs.  She denies previous dark stools Heme:  No previous issues with bleeding or low blood counts   Transfusions:  None in past Neuro:  No headaches, no peripheral tingling or numbness, no syncope.  Sister says pt has not had problems with her memory.  Derm:  No itching, no rash or sores.  Endocrine:  No sweats or chills.  No polyuria or dysuria Immunization:  None.  Travel:  None beyond  local counties in last few months.    PHYSICAL EXAM: Vital signs in last 24 hours: Filed Vitals:   04/15/15 0600  BP: 97/54  Pulse: 89  Temp: 98.1 F (36.7 C)  Resp: 18   Wt Readings from Last 3 Encounters:  04/14/15 133 lb 2.5 oz (60.4 kg)    General: thin, icteric, comfortable.  Head:  No swelling or asymmetry.  Temporal wasting.   Eyes:  +icterus, + conj pallor Ears:  Not HOH  Nose:  No discharge or congestion Mouth:  Clear and moist. edentulous Neck:  No mass or JVD.  No TMG Lungs:  Clear bil.  Diminished at bases.  No dyspnea or cough Heart: tachy, regular into 150s Abdomen:  Soft, no mass, no HSM, no bruits.  NT.  Active BS.   Rectal: melenic stool, no mass   Musc/Skeltl: no joint swelling or contracture Extremities:  No CCEl  Neurologic:  Oriented x 3.   Appropriate.  Her hx contradicts that given by her sister.  Moves all 4s, no gross deficits.  Skin:  No sores, rash Tattoos:  none Nodes:  No cervical adenopathy   Psych:  Pleasant, quiet, relaxed.    Intake/Output from previous day: 07/20 0701 - 07/21 0700 In: 1145 [I.V.:500; Blood:645] Out: 800 [Urine:800] Intake/Output this shift:    LAB RESULTS:  Recent Labs  04/15/15 0126 04/15/15 0750  WBC 17.6* 19.3*  HGB 8.5* 6.6*  HCT 25.7* 19.6*  PLT 337 288   BMET Lab Results  Component Value Date   NA 135 04/15/2015   NA 130* 04/15/2015   K 3.2* 04/15/2015   K 3.2* 04/15/2015   CL 109 04/15/2015   CL 102 04/15/2015   CO2 12* 04/15/2015   CO2 11* 04/15/2015   GLUCOSE 86 04/15/2015   GLUCOSE 99 04/15/2015   BUN 94* 04/15/2015   BUN 100* 04/15/2015   CREATININE 6.32* 04/15/2015   CREATININE 7.44* 04/15/2015   CALCIUM 7.7* 04/15/2015   CALCIUM 7.8* 04/15/2015   LFT  Recent Labs  04/15/15 0126  PROT 5.3*  ALBUMIN 1.7*  AST 114*  ALT 79*  ALKPHOS 682*  BILITOT 40.8*   PT/INR Lab Results  Component Value Date   INR 5.78* 04/15/2015   Hepatitis Panel No results for input(s): HEPBSAG, HCVAB, HEPAIGM, HEPBIGM in the last 72 hours. C-Diff No components found for: CDIFF Lipase     Component Value Date/Time   LIPASE 18* 04/15/2015 0126    Drugs of Abuse  No results found for: LABOPIA, COCAINSCRNUR, LABBENZ, AMPHETMU, THCU, LABBARB   RADIOLOGY STUDIES: Dg Chest Port 1 View  04/15/2015   CLINICAL DATA:  Acute onset of generalized shortness of breath and weakness. Initial encounter.  EXAM: PORTABLE CHEST - 1 VIEW  COMPARISON:  None.  FINDINGS: The lungs are well-aerated. Minimal right basilar density is thought to reflect overlying soft tissues. There is no evidence of focal opacification, pleural effusion or pneumothorax.  The cardiomediastinal silhouette is within normal limits. No acute osseous abnormalities are seen.  IMPRESSION: No acute cardiopulmonary process seen.   Electronically Signed   By: Garald Balding M.D.   On: 04/15/2015 01:51    ENDOSCOPIC STUDIES: None ever  IMPRESSION:   *  Klastskin tumor with obstructive jaundice.  *  UGIB.  ?  Erosion of tumor into SB?   *  Normocytic anemia.  Some of the drop may be due to volume resuscitation. 1 unit PRBC to transfuse today  *  Coagulopathy.  S/p 4  FFP and vitamin K  *  AKI.  Some improvement with IVF.      PLAN:     *  Will switch to Protonix.  *  EGD set for 10 AM tomorrow, if coags corrected sufficiently.  It is possible we can get tissue biopsy at this point.  Also if there is obstruction to the degree that biliary ampulla can not be reached, then we will not need to pursue ERCP, but in place of ERCP: send her for percutaneous procedure.   *  Surgeons have said that in setting of Klatskin's tumor, if surgery is indicated, it would need to be preformed at a tertiary facility.    Azucena Freed  04/15/2015, 9:08 AM Pager: [phone] Attending MD note:   I have taken a history, examined the patient, and reviewed the chart. I agree with the Advanced Practitioner's impression and recommendations.I have reviewed the CT scan s  With Dr Reid-,radiologist  obstructing mass extrinsic to CBD, obstructing the CBD in mid portion, could be ampullary, or pancreatic  tumor or a primary duodenal carcinoma. Duodenal C-loop is narrow, and pt is having a GIB , possible erosion of the tumor into duodenum. Massive distention of intrahepatic and extrahepatic ducts. Will endoscope tomorrow  To see if ERCP/stent possible ( we cannot get time for ERCP tomorrow), and if duodenal c-loop obstructed, she may need a percutaneous drainage and eventually internalization of the stent in IR. Agree with Vit K and FFP. Recheck Protime in am.  Ca19-9, CEA  [facility] Pager # [phone]

## 2015-04-15 NOTE — Progress Notes (Signed)
[facility] Progress Note Patient Name: Heather Harmon DOB: 09-Apr-1946 MRN: [id number]   Date of Service  04/15/2015  HPI/Events of Note  Notified of post-transfusion labs by RN. INR 1.2 & Hgb >7.0. Still having episodes of tachycardia.  eICU Interventions  CBC & INR AM 7/22. Checking EKG. Has Lopressor ordered.     Intervention Category Minor Interventions: Communication with other healthcare providers and/or family  Tera Partridge 04/15/2015, 6:27 PM

## 2015-04-15 NOTE — Progress Notes (Signed)
[facility] Progress Note Patient Name: Heather Harmon DOB: 03/13/1946 MRN: [id number]   Date of Service  04/15/2015  HPI/Events of Note  Lactic acid 1.0.  K low  Mg Low  eICU Interventions  K and Mg supp     Intervention Category Major Interventions: Electrolyte abnormality - evaluation and management;Acid-Base disturbance - evaluation and management  Asencion Noble 04/15/2015, 3:32 AM

## 2015-04-15 NOTE — Progress Notes (Signed)
ANTIBIOTIC CONSULT NOTE - INITIAL  Pharmacy Consult for Zosyn Indication: UTI and cholecystitis   69yo female tx'd to ICU from [facility] where she presented c/o yellow skin/eyes and progressively worsening SOB w/ minimal exertion, CT shows significant intrahepatic and proximal extrahepatic biliary dilation and questionably obstructing mass lesion at the CBD concerning for cholangiocarcinoma as well as contracted gallbladder containing high attenuation material.  Pt w/ AKI, may need HD.  Will begin Zosyn 2.25g IV Q8H and monitor CBC, Cx, CrCl.  Wynona Neat, PharmD, BCPS  04/15/2015,12:37 AM

## 2015-04-15 NOTE — Consult Note (Signed)
Heather Harmon August 14, 1946  [id number].   Primary Care MD: Dr. Marco Collie Requesting MD: Dr. Domingo Mend Chief Complaint/Reason for Consult: obstructed CBD HPI: This is a very sweet 69 yo black female who states she has no PMH.  She is not confused, but seems to be a poor historian.  Everything she tells me is different from the H&P HPI.  She states her eyes have been yellow for a couple of weeks, not a month.  She denies any abdominal pain whatsoever.  She denies nausea, vomiting, blood in her stool, change in stool color, or clay colored stools.  She states she went to [facility] where she had a CT scan, unclear why she went to [facility] in the first place since she denies all symptoms, but nonetheless, had a CT scan that revealed intra and extrahepatic ductal dilatation with a mass obstructing her CBD.  She went into SVT there and was noted to be septic with hypotension and WBC of 23K.  She was in renal failure with a cr of 7 and INR of 5 from likely intrinsic liver disease as she takes no meds at home.  She was then transferred to [facility] to the critical care service.  We have been asked to evaluate her for further recommendations.  ROS : Please see HPI, otherwise she denies any other symptoms to me  History reviewed. No pertinent family history.  History reviewed. No pertinent past medical history.  History reviewed. No pertinent past surgical history.  Social History:  has no tobacco, alcohol, and drug history on file.  Allergies: No Known Allergies  No prescriptions prior to admission    Blood pressure 91/56, pulse 94, temperature 97.8 F (36.6 C), temperature source Oral, resp. rate 25, height _0  (1.651 m), weight 60.4 kg (133 lb 2.5 oz), SpO2 91 %. Physical Exam: General: pleasant, WD, WN black female who is laying in bed in NAD HEENT: head is normocephalic, atraumatic.  Sclera are very icteric.  PERRL.  Ears and nose without any masses or lesions.  Mouth is pink but  dry Heart: regular, rate, and rhythm.  Normal s1,s2. No obvious gallops, or rubs noted, but may have a murmur.  Palpable radial and pedal pulses bilaterally Lungs: CTAB, no wheezes, rhonchi, or rales noted.  Respiratory effort nonlabored Abd: soft, NT, ND, +BS, no masses, hernias, or organomegaly MS: all 4 extremities are symmetrical with no cyanosis, clubbing, or edema. Skin: warm and dry with no masses, lesions, or rashes Psych: A&Ox3 with an appropriate affect.    Results for orders placed or performed during the hospital encounter of 04/14/15 (from the past 48 hour(s))  MRSA PCR Screening     Status: None   Collection Time: 04/14/15 10:56 PM  Result Value Ref Range   MRSA by PCR NEGATIVE NEGATIVE    Comment:        The GeneXpert MRSA Assay (FDA approved for NASAL specimens only), is one component of a comprehensive MRSA colonization surveillance program. It is not intended to diagnose MRSA infection nor to guide or monitor treatment for MRSA infections.   Urinalysis, Routine w reflex microscopic (not at [facility])     Status: Abnormal   Collection Time: 04/15/15 12:48 AM  Result Value Ref Range   Color, Urine AMBER (A) YELLOW    Comment: BIOCHEMICALS MAY BE AFFECTED BY COLOR   APPearance CLOUDY (A) CLEAR   Specific Gravity, Urine 1.006 1.005 - 1.030   pH 6.0 5.0 - 8.0  Glucose, UA NEGATIVE NEGATIVE mg/dL   Hgb urine dipstick MODERATE (A) NEGATIVE   Bilirubin Urine LARGE (A) NEGATIVE   Ketones, ur NEGATIVE NEGATIVE mg/dL   Protein, ur NEGATIVE NEGATIVE mg/dL   Urobilinogen, UA 0.2 0.0 - 1.0 mg/dL   Nitrite NEGATIVE NEGATIVE   Leukocytes, UA SMALL (A) NEGATIVE  Urine microscopic-add on     Status: None   Collection Time: 04/15/15 12:48 AM  Result Value Ref Range   Squamous Epithelial / LPF RARE RARE   WBC, UA 3-6 <3 WBC/hpf   RBC / HPF 3-6 <3 RBC/hpf   Bacteria, UA RARE RARE  Comprehensive metabolic panel     Status: Abnormal   Collection Time: 04/15/15  1:26 AM   Result Value Ref Range   Sodium 130 (L) 135 - 145 mmol/L   Potassium 3.2 (L) 3.5 - 5.1 mmol/L   Chloride 102 101 - 111 mmol/L   CO2 11 (L) 22 - 32 mmol/L   Glucose, Bld 99 65 - 99 mg/dL   BUN 100 (H) 6 - 20 mg/dL   Creatinine, Ser 7.44 (H) 0.44 - 1.00 mg/dL   Calcium 7.8 (L) 8.9 - 10.3 mg/dL   Total Protein 5.3 (L) 6.5 - 8.1 g/dL   Albumin 1.7 (L) 3.5 - 5.0 g/dL   AST 114 (H) 15 - 41 U/L   ALT 79 (H) 14 - 54 U/L   Alkaline Phosphatase 682 (H) 38 - 126 U/L   Total Bilirubin 40.8 (HH) 0.3 - 1.2 mg/dL    Comment: REPEATED TO VERIFY RESULTS CONFIRMED BY MANUAL DILUTION CRITICAL RESULT CALLED TO, READ BACK BY AND VERIFIED WITH: SMITH F,RN 04/15/15 0311 WAYK    GFR calc non Af Amer 5 (L) >60 mL/min   GFR calc Af Amer 6 (L) >60 mL/min    Comment: (NOTE) The eGFR has been calculated using the CKD EPI equation. This calculation has not been validated in all clinical situations. eGFR's persistently <60 mL/min signify possible Chronic Kidney Disease.    Anion gap 17 (H) 5 - 15  Magnesium     Status: Abnormal   Collection Time: 04/15/15  1:26 AM  Result Value Ref Range   Magnesium 1.2 (L) 1.7 - 2.4 mg/dL  Phosphorus     Status: Abnormal   Collection Time: 04/15/15  1:26 AM  Result Value Ref Range   Phosphorus 7.1 (H) 2.5 - 4.6 mg/dL  Amylase     Status: Abnormal   Collection Time: 04/15/15  1:26 AM  Result Value Ref Range   Amylase 27 (L) 28 - 100 U/L  Lipase, blood     Status: Abnormal   Collection Time: 04/15/15  1:26 AM  Result Value Ref Range   Lipase 18 (L) 22 - 51 U/L  Troponin I     Status: Abnormal   Collection Time: 04/15/15  1:26 AM  Result Value Ref Range   Troponin I 0.10 (H) <0.031 ng/mL    Comment:        PERSISTENTLY INCREASED TROPONIN VALUES IN THE RANGE OF 0.04-0.49 ng/mL CAN BE SEEN IN:       -UNSTABLE ANGINA       -CONGESTIVE HEART FAILURE       -MYOCARDITIS       -CHEST TRAUMA       -ARRYHTHMIAS       -LATE PRESENTING MYOCARDIAL INFARCTION        -COPD   CLINICAL FOLLOW-UP RECOMMENDED.   Lactic acid, plasma     Status:  None   Collection Time: 04/15/15  1:26 AM  Result Value Ref Range   Lactic Acid, Venous 1.0 0.5 - 2.0 mmol/L  Procalcitonin     Status: None   Collection Time: 04/15/15  1:26 AM  Result Value Ref Range   Procalcitonin 5.65 ng/mL    Comment:        Interpretation: PCT > 2 ng/mL: Systemic infection (sepsis) is likely, unless other causes are known. (NOTE)         ICU PCT Algorithm               Non ICU PCT Algorithm    ----------------------------     ------------------------------         PCT < 0.25 ng/mL                 PCT < 0.1 ng/mL     Stopping of antibiotics            Stopping of antibiotics       strongly encouraged.               strongly encouraged.    ----------------------------     ------------------------------       PCT level decrease by               PCT < 0.25 ng/mL       >= 80% from peak PCT       OR PCT 0.25 - 0.5 ng/mL          Stopping of antibiotics                                             encouraged.     Stopping of antibiotics           encouraged.    ----------------------------     ------------------------------       PCT level decrease by              PCT >= 0.25 ng/mL       < 80% from peak PCT        AND PCT >= 0.5 ng/mL            Continuing antibiotics                                               encouraged.       Continuing antibiotics            encouraged.    ----------------------------     ------------------------------     PCT level increase compared          PCT > 0.5 ng/mL         with peak PCT AND          PCT >= 0.5 ng/mL             Escalation of antibiotics                                          strongly encouraged.      Escalation of antibiotics        strongly encouraged.   Brain natriuretic  peptide     Status: Abnormal   Collection Time: 04/15/15  1:26 AM  Result Value Ref Range   B Natriuretic Peptide 121.2 (H) 0.0 - 100.0 pg/mL  CBC     Status:  Abnormal   Collection Time: 04/15/15  1:26 AM  Result Value Ref Range   WBC 17.6 (H) 4.0 - 10.5 K/uL   RBC 3.12 (L) 3.87 - 5.11 MIL/uL   Hemoglobin 8.5 (L) 12.0 - 15.0 g/dL   HCT 25.7 (L) 36.0 - 46.0 %   MCV 82.4 78.0 - 100.0 fL   MCH 27.2 26.0 - 34.0 pg   MCHC 33.1 30.0 - 36.0 g/dL   RDW 17.4 (H) 11.5 - 15.5 %   Platelets 337 150 - 400 K/uL  Protime-INR     Status: Abnormal   Collection Time: 04/15/15  1:26 AM  Result Value Ref Range   Prothrombin Time 50.1 (H) 11.6 - 15.2 seconds    Comment: REPEATED TO VERIFY   INR 5.78 (HH) 0.00 - 1.49    Comment: REPEATED TO VERIFY CRITICAL RESULT CALLED TO, READ BACK BY AND VERIFIED WITH: S LOVE,RN 0240 04/15/15 D BRADLEY   Type and screen     Status: None (Preliminary result)   Collection Time: 04/15/15  1:26 AM  Result Value Ref Range   ABO/RH(D) B POS    Antibody Screen NEG    Sample Expiration 04/18/2015    Unit Number [id number]    Blood Component Type RED CELLS,LR    Unit division 00    Status of Unit ALLOCATED    Transfusion Status OK TO TRANSFUSE    Crossmatch Result Compatible    Unit Number [id number]    Blood Component Type RED CELLS,LR    Unit division 00    Status of Unit ALLOCATED    Transfusion Status OK TO TRANSFUSE    Crossmatch Result Compatible   ABO/Rh     Status: None   Collection Time: 04/15/15  1:26 AM  Result Value Ref Range   ABO/RH(D) B POS   Prepare fresh frozen plasma     Status: None (Preliminary result)   Collection Time: 04/15/15  1:58 AM  Result Value Ref Range   Unit Number [id number]    Blood Component Type THAWED PLASMA    Unit division 00    Status of Unit ISSUED    Transfusion Status OK TO TRANSFUSE    Unit Number [id number]    Blood Component Type THAWED PLASMA    Unit division 00    Status of Unit ISSUED    Transfusion Status OK TO TRANSFUSE   Troponin I     Status: Abnormal   Collection Time: 04/15/15  7:50 AM  Result Value Ref Range   Troponin I 0.05 (H) <0.031 ng/mL     Comment:        PERSISTENTLY INCREASED TROPONIN VALUES IN THE RANGE OF 0.04-0.49 ng/mL CAN BE SEEN IN:       -UNSTABLE ANGINA       -CONGESTIVE HEART FAILURE       -MYOCARDITIS       -CHEST TRAUMA       -ARRYHTHMIAS       -LATE PRESENTING MYOCARDIAL INFARCTION       -COPD   CLINICAL FOLLOW-UP RECOMMENDED.   CBC     Status: Abnormal   Collection Time: 04/15/15  7:50 AM  Result Value Ref Range   WBC 19.3 (H) 4.0 - 10.5 K/uL   RBC 2.33 (  L) 3.87 - 5.11 MIL/uL   Hemoglobin 6.6 (LL) 12.0 - 15.0 g/dL    Comment: REPEATED TO VERIFY CRITICAL RESULT CALLED TO, READ BACK BY AND VERIFIED WITH: T PARKER,RN AT 8938 04/15/15 BY K BARR    HCT 19.6 (L) 36.0 - 46.0 %   MCV 84.1 78.0 - 100.0 fL   MCH 28.3 26.0 - 34.0 pg   MCHC 33.7 30.0 - 36.0 g/dL   RDW 17.1 (H) 11.5 - 15.5 %   Platelets 288 150 - 400 K/uL  Basic metabolic panel     Status: Abnormal   Collection Time: 04/15/15  7:50 AM  Result Value Ref Range   Sodium 135 135 - 145 mmol/L   Potassium 3.2 (L) 3.5 - 5.1 mmol/L   Chloride 109 101 - 111 mmol/L   CO2 12 (L) 22 - 32 mmol/L   Glucose, Bld 86 65 - 99 mg/dL   BUN 94 (H) 6 - 20 mg/dL   Creatinine, Ser 6.32 (H) 0.44 - 1.00 mg/dL   Calcium 7.7 (L) 8.9 - 10.3 mg/dL   GFR calc non Af Amer 6 (L) >60 mL/min   GFR calc Af Amer 7 (L) >60 mL/min    Comment: (NOTE) The eGFR has been calculated using the CKD EPI equation. This calculation has not been validated in all clinical situations. eGFR's persistently <60 mL/min signify possible Chronic Kidney Disease.    Anion gap 14 5 - 15  Magnesium     Status: Abnormal   Collection Time: 04/15/15  7:50 AM  Result Value Ref Range   Magnesium 1.6 (L) 1.7 - 2.4 mg/dL  Phosphorus     Status: Abnormal   Collection Time: 04/15/15  7:50 AM  Result Value Ref Range   Phosphorus 6.4 (H) 2.5 - 4.6 mg/dL  Prepare fresh frozen plasma     Status: None (Preliminary result)   Collection Time: 04/15/15  8:02 AM  Result Value Ref Range   Unit Number  [id number]    Blood Component Type THAWED PLASMA    Unit division 00    Status of Unit ISSUED    Transfusion Status OK TO TRANSFUSE    Unit Number [id number]    Blood Component Type THAWED PLASMA    Unit division 00    Status of Unit ALLOCATED    Transfusion Status OK TO TRANSFUSE    Unit Number [id number]    Blood Component Type THAWED PLASMA    Unit division 00    Status of Unit ALLOCATED    Transfusion Status OK TO TRANSFUSE    Unit Number [id number]    Blood Component Type THAWED PLASMA    Unit division 00    Status of Unit ISSUED    Transfusion Status OK TO TRANSFUSE   Prepare RBC     Status: None   Collection Time: 04/15/15  9:37 AM  Result Value Ref Range   Order Confirmation ORDER PROCESSED BY BLOOD BANK    Dg Chest Port 1 View  04/15/2015   CLINICAL DATA:  Acute onset of generalized shortness of breath and weakness. Initial encounter.  EXAM: PORTABLE CHEST - 1 VIEW  COMPARISON:  None.  FINDINGS: The lungs are well-aerated. Minimal right basilar density is thought to reflect overlying soft tissues. There is no evidence of focal opacification, pleural effusion or pneumothorax.  The cardiomediastinal silhouette is within normal limits. No acute osseous abnormalities are seen.  IMPRESSION: No acute cardiopulmonary process seen.   Electronically Signed   By: Jacqulynn Cadet  Chang M.D.   On: 04/15/2015 01:51       Assessment/Plan 1. Obstructing lesion of CBD, likely cholangiocarcinoma -CD of images currently can not be found.  CT report reveals an abrupt cut off of the CBD just short of the pancreatic head, raising question of a extrahepatic/extrapancreatic mass concerning for cholangiocarcinoma.  Her TB is 41, alkphos in the 600s.  Her INR is over 5 from intrinsic liver disease from this process.  She appears to be septic, likely from cholangitis, essentially, from this obstructing lesion.  She will require a GI consult for possible ERCP and stent placement with possible  brushings for diagnosis as well.  If GI is unable to place a stent, she will likely require IR consult for PTC drain to relieve her obstruction and sepsis.  I will need to find her images and review them with Dr. Grandville Silos.  Dr. Barry Dienes does not operate on some types of cholangios pending their locations.  It is possible, when she is at the point where she needs surgical intervention, she may require treatment at a tertiary care facility.  Agree with abx therapy currently.  No role for surgical intervention at this time.  We will follow with you.  Thank you for this consultation.  Jvon Meroney E 04/15/2015, 9:58 AM Pager: [phone]

## 2015-04-16 ENCOUNTER — Encounter (HOSPITAL_COMMUNITY)
Admission: AD | Disposition: A | Discharge status: Home Health | Payer: Self-pay | Source: Other Acute Inpatient Hospital | Attending: Internal Medicine

## 2015-04-16 ENCOUNTER — Inpatient Hospital Stay (HOSPITAL_COMMUNITY): Payer: Commercial Managed Care - HMO

## 2015-04-16 ENCOUNTER — Encounter (HOSPITAL_COMMUNITY): Payer: Self-pay

## 2015-04-16 DIAGNOSIS — K921 Melena: Secondary | ICD-10-CM

## 2015-04-16 HISTORY — PX: ESOPHAGOGASTRODUODENOSCOPY: SHX5428

## 2015-04-16 LAB — COMPREHENSIVE METABOLIC PANEL
ALT: 60 U/L — ABNORMAL HIGH (ref 14–54)
AST: 72 U/L — AB (ref 15–41)
Albumin: 2.1 g/dL — ABNORMAL LOW (ref 3.5–5.0)
Alkaline Phosphatase: 542 U/L — ABNORMAL HIGH (ref 38–126)
Anion gap: 17 — ABNORMAL HIGH (ref 5–15)
BUN: 81 mg/dL — ABNORMAL HIGH (ref 6–20)
CALCIUM: 8 mg/dL — AB (ref 8.9–10.3)
CHLORIDE: 102 mmol/L (ref 101–111)
CO2: 19 mmol/L — ABNORMAL LOW (ref 22–32)
Creatinine, Ser: 6.08 mg/dL — ABNORMAL HIGH (ref 0.44–1.00)
GFR calc non Af Amer: 6 mL/min — ABNORMAL LOW (ref >60)
GFR, EST AFRICAN AMERICAN: 7 mL/min — AB (ref >60)
GLUCOSE: 103 mg/dL — AB (ref 65–99)
Potassium: 3 mmol/L — ABNORMAL LOW (ref 3.5–5.1)
SODIUM: 138 mmol/L (ref 135–145)
TOTAL PROTEIN: 5.6 g/dL — AB (ref 6.5–8.1)
Total Bilirubin: 42.2 mg/dL (ref 0.3–1.2)

## 2015-04-16 LAB — PREPARE FRESH FROZEN PLASMA
UNIT DIVISION: 0
UNIT DIVISION: 0
UNIT DIVISION: 0
UNIT DIVISION: 0
Unit division: 0
Unit division: 0

## 2015-04-16 LAB — PROTIME-INR
INR: 1.08 (ref 0.00–1.49)
Prothrombin Time: 14.2 seconds (ref 11.6–15.2)

## 2015-04-16 LAB — CBC
HEMATOCRIT: 25.7 % — AB (ref 36.0–46.0)
Hemoglobin: 9 g/dL — ABNORMAL LOW (ref 12.0–15.0)
MCH: 29.9 pg (ref 26.0–34.0)
MCHC: 35 g/dL (ref 30.0–36.0)
MCV: 85.4 fL (ref 78.0–100.0)
Platelets: 267 10*3/uL (ref 150–400)
RBC: 3.01 MIL/uL — ABNORMAL LOW (ref 3.87–5.11)
RDW: 16 % — AB (ref 11.5–15.5)
WBC: 16.9 10*3/uL — ABNORMAL HIGH (ref 4.0–10.5)

## 2015-04-16 LAB — HIV ANTIBODY (ROUTINE TESTING W REFLEX): HIV SCREEN 4TH GENERATION: NONREACTIVE

## 2015-04-16 LAB — RENAL FUNCTION PANEL
ALBUMIN: 2 g/dL — AB (ref 3.5–5.0)
Anion gap: 15 (ref 5–15)
BUN: 78 mg/dL — AB (ref 6–20)
CO2: 19 mmol/L — AB (ref 22–32)
CREATININE: 6.03 mg/dL — AB (ref 0.44–1.00)
Calcium: 8.1 mg/dL — ABNORMAL LOW (ref 8.9–10.3)
Chloride: 106 mmol/L (ref 101–111)
GFR calc Af Amer: 8 mL/min — ABNORMAL LOW (ref >60)
GFR calc non Af Amer: 6 mL/min — ABNORMAL LOW (ref >60)
GLUCOSE: 93 mg/dL (ref 65–99)
POTASSIUM: 3.4 mmol/L — AB (ref 3.5–5.1)
Phosphorus: 5.2 mg/dL — ABNORMAL HIGH (ref 2.5–4.6)
SODIUM: 140 mmol/L (ref 135–145)

## 2015-04-16 LAB — HEPATITIS B SURFACE ANTIBODY,QUALITATIVE: HEP B S AB: REACTIVE

## 2015-04-16 LAB — CREATININE, URINE, RANDOM: CREATININE, URINE: 44.91 mg/dL

## 2015-04-16 LAB — URINE CULTURE

## 2015-04-16 LAB — PROCALCITONIN: Procalcitonin: 5.65 ng/mL

## 2015-04-16 LAB — CANCER ANTIGEN 19-9: CA 19 9: 8868 U/mL — AB (ref 0–35)

## 2015-04-16 LAB — SODIUM, URINE, RANDOM: Sodium, Ur: 98 mmol/L

## 2015-04-16 LAB — HEPATITIS C ANTIBODY: HCV Ab: <0.1 s/co ratio (ref 0.0–0.9)

## 2015-04-16 LAB — CLOSTRIDIUM DIFFICILE BY PCR: Toxigenic C. Difficile by PCR: NEGATIVE

## 2015-04-16 SURGERY — EGD (ESOPHAGOGASTRODUODENOSCOPY)
Anesthesia: Moderate Sedation

## 2015-04-16 MED ORDER — MIDAZOLAM HCL 2 MG/2ML IJ SOLN
INTRAMUSCULAR | Status: AC | PRN
  Administered 2015-04-16 (×2): 1 mg via INTRAVENOUS

## 2015-04-16 MED ORDER — IOHEXOL 300 MG/ML  SOLN
50.0000 mL | Freq: Once | INTRAMUSCULAR | Status: AC | PRN
  Administered 2015-04-16: 10 mL

## 2015-04-16 MED ORDER — CIPROFLOXACIN IN D5W 400 MG/200ML IV SOLN
400.0000 mg | INTRAVENOUS | Status: AC
Start: 2015-04-16 — End: 2015-04-16
  Administered 2015-04-16: 400 mg via INTRAVENOUS
  Filled 2015-04-16 (×3): qty 200

## 2015-04-16 MED ORDER — POTASSIUM CHLORIDE 10 MEQ/100ML IV SOLN
10.0000 meq | INTRAVENOUS | Status: AC
  Administered 2015-04-16 (×4): 10 meq via INTRAVENOUS
  Filled 2015-04-16 (×4): qty 100

## 2015-04-16 MED ORDER — PHENYLEPHRINE HCL 10 MG/ML IJ SOLN
30.0000 ug/min | INTRAVENOUS | Status: DC
  Filled 2015-04-16 (×2): qty 1

## 2015-04-16 MED ORDER — MIDAZOLAM HCL 2 MG/2ML IJ SOLN
INTRAMUSCULAR | Status: AC
  Filled 2015-04-16: qty 4

## 2015-04-16 MED ORDER — FENTANYL CITRATE (PF) 100 MCG/2ML IJ SOLN
INTRAMUSCULAR | Status: AC
  Filled 2015-04-16: qty 2

## 2015-04-16 MED ORDER — DIPHENHYDRAMINE HCL 50 MG/ML IJ SOLN
INTRAMUSCULAR | Status: AC
  Filled 2015-04-16: qty 1

## 2015-04-16 MED ORDER — LIDOCAINE HCL 1 % IJ SOLN
INTRAMUSCULAR | Status: AC
  Filled 2015-04-16: qty 20

## 2015-04-16 MED ORDER — SODIUM CHLORIDE 0.9 % IV SOLN: INTRAVENOUS | Status: DC

## 2015-04-16 MED ORDER — MIDAZOLAM HCL 10 MG/2ML IJ SOLN
INTRAMUSCULAR | Status: DC | PRN
  Administered 2015-04-16: 2 mg via INTRAVENOUS

## 2015-04-16 MED ORDER — FENTANYL CITRATE (PF) 100 MCG/2ML IJ SOLN
INTRAMUSCULAR | Status: AC | PRN
  Administered 2015-04-16: 50 ug via INTRAVENOUS
  Administered 2015-04-16 (×2): 25 ug via INTRAVENOUS

## 2015-04-16 MED ORDER — FENTANYL CITRATE (PF) 100 MCG/2ML IJ SOLN
INTRAMUSCULAR | Status: DC | PRN
  Administered 2015-04-16: 12.5 ug via INTRAVENOUS

## 2015-04-16 MED ORDER — SODIUM CHLORIDE 0.9 % IV SOLN
INTRAVENOUS | Status: DC
  Administered 2015-04-16: 21:00:00 via INTRAVENOUS

## 2015-04-16 MED ORDER — MIDAZOLAM HCL 5 MG/ML IJ SOLN
INTRAMUSCULAR | Status: AC
  Filled 2015-04-16: qty 2

## 2015-04-16 NOTE — Procedures (Signed)
Interventional Radiology Procedure Note  Procedure:  Percutaneous biliary drainage with cholangiography  Complications:  None  Estimated Blood Loss: <10 mL  Findings:  Percutaneous access of right intrahepatic bile duct under ultrasound.  Cholangiogram shows high grade biliary obstruction at level of distal CBD/ampulla.  Obstruction crossed with wire, allowing placement of 10 Fr internal/external biliary drainage catheter.  Catheter advanced to duodenum and drainage extends up into central intrahepatic ducts.  Catheter attached to gravity bag.  Recommendations: Follow bilirubin and bile output. Once biliary tree decompressed for several days/week, the distal obstruction should be amenable to percutaneous covered stent placement.   Venetia Night. Kathlene Cote, M.D Pager:  [phone]

## 2015-04-16 NOTE — Sedation Documentation (Signed)
Patient is resting comfortably. 

## 2015-04-16 NOTE — Progress Notes (Signed)
Subjective: 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 Nephrology was consulted for assistance with management of acute renal failure.  Patient was seen and examined this morning. Patient denies any complaints- no itching, confusion, shortness of breath.   Objective: Filed Vitals:   04/16/15 1110 04/16/15 1115 04/16/15 1130 04/16/15 1200  BP: 93/68 105/71 103/64 100/58  Pulse: 90 87 89 87  Temp:      TempSrc:      Resp: 22 16 22 19   Height:      Weight:      SpO2: 100% 100% 100% 100%   General: Vital signs reviewed. Patient is chronically ill-appearing, in no acute distress and cooperative with exam.  Eyes: +Scleral icterus Cardiovascular: Regular rate, regular rhythm, S1 normal, S2 normal Pulmonary/Chest: Clear to auscultation bilaterally, no wheezes, rales, or rhonchi. Abdominal: Soft, non-tender, non-distended, BS +, no guarding present.  Extremities: No lower extremity edema bilaterally Neurological: A&O Skin: Jaundice. Warm, dry and intact.   Intake/Output from previous day: 07/21 0701 - 07/22 0700 In: 4367.9 [I.V.:1776.3; Blood:1941.7; IV Piggyback:650] Out: 2035 [Urine:2035] Intake/Output this shift: Total I/O In: 725 [I.V.:375; IV Piggyback:350] Out: 730 [Urine:730]  Lab Results:  Recent Labs  04/15/15 1646 04/16/15 0127  WBC 16.5* 16.9*  HGB 7.8* 9.0*  HCT 22.7* 25.7*  PLT 274 267   BMET:   Recent Labs  04/15/15 1330 04/16/15 0127  NA 138 138  K 2.8* 3.0*  CL 108 102  CO2 11* 19*  GLUCOSE 85 103*  BUN 82* 81*  CREATININE 6.24* 6.08*  CALCIUM 8.1* 8.0*   Studies/Results: Dg Chest Port 1 View  04/15/2015   CLINICAL DATA:  Acute onset of generalized shortness of breath and weakness. Initial encounter.  EXAM: PORTABLE CHEST - 1 VIEW  COMPARISON:  None.  FINDINGS: The lungs are well-aerated. Minimal right basilar density is thought to reflect  overlying soft tissues. There is no evidence of focal opacification, pleural effusion or pneumothorax.  The cardiomediastinal silhouette is within normal limits. No acute osseous abnormalities are seen.  IMPRESSION: No acute cardiopulmonary process seen.   Electronically Signed   By: Garald Balding M.D.   On: 04/15/2015 01:51   I have reviewed the patient's current medications. Prior to Admission:  No prescriptions prior to admission   Scheduled: . sodium chloride   Intravenous Once  . sodium chloride   Intravenous Once  . sodium chloride   Intravenous Once  . pantoprazole (PROTONIX) IV  40 mg Intravenous Q12H  . piperacillin-tazobactam (ZOSYN)  IV  2.25 g Intravenous Q8H   Continuous: . phenylephrine (NEO-SYNEPHRINE) Adult infusion    .  sodium bicarbonate  infusion 1000 mL 75 mL/hr at 04/15/15 2000   [facility]:NOBSJG chloride, metoprolol  Assessment/Plan: Acute Renal Failure: Likely secondary to Acute Tubular Necrosis due to hypoperfusion related to septic shock and liver disease. SCr 7.44>6.32>6.08. GFR of 7. There is no indication for CRRT at this time. We will continue to treat with fluids, bicarb and monitor UOP for now.  -Trend Renal Function Panel -Monitor UOP -Continue Sodium Bicarbonate 150 mEq in D5W at 75 cc/hr -Check Urine Na and Urine Cr to assess for HRS  Suspect ATN with hypoperfusion, poor intake. Check U Na. Improving with IVF cont  Hypotension: On Neo.  Hypokalemia: Potassium 3.0 this morning. Patient received KCl 10 mEq x 4 this morning until 0730, we will recheck potassium this  afternoon to make sure she has corrected. -Repeat renal function panel this afternoon  Hyperphosphatemia: Phosphorus 7.1>6.4. -Repeat RFP this afternoon  Normocytic Anemia in setting of UGIB: Patient passed a medium sized maroon stool at 0200. Hgb 8.5>6.6>9.0 after replacement. Unknown baseline. Patient may have had erosion of the tumor into into surround vessels causing bleed.  -Trend  CBC -Transfuse as needed  [place] tumor with Obstructive Jaundice: CA 19.9 > 8000. EGD this morning revealed mass in the peri-ampullary area and 2nd part of duodenum that was friable and exophytic consistent with carcinoma. GI is ruling out primary pancreatic cancer vs. ampullary vs. duodenal.  -On Protonix and Zosyn -PCCM, GI and Surgery following -IR consulted for percutaneous drainage via transhepatic cholangiogram -Tumor markers and biopsy results pending  Coagulopathy: Likely from liver insult from Kl[place]kin tumor versus underlying intrinsic disease. Prothrombin time 14.2, INR 1.08. Patient received FFP x 6 and Vitamin K. HIV negative, Hep C ab negative. HepBsAb positive, HBsAg pending. -Vitamin K -Monitor -Repeat CMET tomorrow am  -Hepatitis B surface antigen pending   LOS: 2 days   [name], DO PGY-2 Internal Medicine Resident Pager # [phone] 04/16/2015 1:22 PM I have seen and examined this patient and agree with the plan of care seen, examined , eval, discussed with residents .  Latroya Ng L 04/16/2015, 1:32 PM

## 2015-04-16 NOTE — Care Management Note (Signed)
Case Management Note  Patient Details  Name: TANEE HENERY MRN: [id number] Date of Birth: Jul 25, 1946  Subjective/Objective:    Lives at home alone until recently when she became weak and was staying with niece.  Can you back there on discharge.  Two sisters in room with her, Jocelyn Lamer and Elon Jester and niece, Jarrett Soho.  Very supportive.  Has no home equipment, independent, just weak now and was stumbling.  Could benefit from PT consult.                 Action/Plan:   Expected Discharge Date:                  Expected Discharge Plan:  Home/Self Care  In-House Referral:     Discharge planning Services     Post Acute Care Choice:    Choice offered to:     DME Arranged:    DME Agency:     HH Arranged:    HH Agency:     Status of Service:  In process, will continue to follow  Medicare Important Message Given:    Date Medicare IM Given:    Medicare IM give by:    Date Additional Medicare IM Given:    Additional Medicare Important Message give by:     If discussed at [place] of Stay Meetings, dates discussed:    Additional Comments:  Vergie Living, RN 04/16/2015, 11:22 AM

## 2015-04-16 NOTE — Progress Notes (Signed)
[facility] Progress Note Patient Name: Heather Harmon DOB: 04-12-1946 MRN: [id number]   Date of Service  04/16/2015  HPI/Events of Note  Hypokalemia  eICU Interventions  Potassium replaced     Intervention Category Intermediate Interventions: Electrolyte abnormality - evaluation and management  DETERDING,ELIZABETH 04/16/2015, 2:40 AM

## 2015-04-16 NOTE — Progress Notes (Signed)
[facility] Progress Note Patient Name: Heather Harmon DOB: 09/01/1946 MRN: [id number]   Date of Service  04/16/2015  HPI/Events of Note  Pt s/p perc drain of biliary obstruction & requesting diet.  eICU Interventions  Starting clear liquid diet as tolerated     Intervention Category Intermediate Interventions: Other:  Tera Partridge 04/16/2015, 6:47 PM

## 2015-04-16 NOTE — Op Note (Signed)
[facility] [place], [id number]   ENDOSCOPY PROCEDURE REPORT  PATIENT: Heather, Harmon  MR#: [id number] BIRTHDATE: Feb 07, 1946 , 58  yrs. old GENDER: female ENDOSCOPIST: Lafayette Dragon, MD REFERRED BY:  Dr Kara Mead PROCEDURE DATE:  04/16/2015 PROCEDURE:  EGD w/ biopsy ASA CLASS:     Class III INDICATIONS:  jaundice.  Bilirubin 40.2, imaging studies show mass at the distal common bile duct.  Patient having melanoma.  Weight loss of 30 pounds,. MEDICATIONS: 12.5 and Versed 2 mg IV TOPICAL ANESTHETIC: Cetacaine Spray  DESCRIPTION OF PROCEDURE: After the risks benefits and alternatives of the procedure were thoroughly explained, informed consent was obtained.  The    endoscope was introduced through the mouth and advanced to the second portion of the duodenum , Without limitations.  The instrument was slowly withdrawn as the mucosa was fully examined.      ESOPHAGUS: The mucosa of the esophagus appeared normal.  STOMACH: The mucosa of the stomach appeared normal.  gastric outlet was unremarkable. Retroflexion of the endoscope revealed normal fundus and cardia  DUODENUM: A half circumferential fungating and ulcerated mass with friable surfaces was found in the peri-ampullary area and 2nd part duodenum.endoscope traversed into second portion of duodenum without difficulty  Multiple biopsies and brushings  were performed using cold forceps.  Sample sent for histology. ampulla was not visualized due to the mass obscuring the mucosa. It was not clear whether the mass was pancreatic, primary duodenal or ampullary Retroflexed views revealed no abnormalities.     The scope was then withdrawn from the patient and the procedure completed.  COMPLICATIONS: There were no immediate complications.  ENDOSCOPIC IMPRESSION: 1.   The mucosa of the esophagus appeared normal 2.   The mucosa of the stomach appeared normal 3.   Half circumferential  mass was found in the peri-ampullary area and 2nd part duodenum; multiple biopsies and brushings were performed  mass is friable and exophytic. Consistent with the carcinoma.,rule out primary pancreatic. Versus ampullary versus duodenal, mass is not obstructing yet  RECOMMENDATIONS: Await pathology results follow GI blood loss and transfuse as necessary Will consult interventional radiology to attempt percutaneous drainage via transhepatic cholangiogram.,later on may need internalization of the stent Continue any twice a day coverage Tumor markers pending  REPEAT EXAM: for EGD pending biopsy results.  eSigned:  Lafayette Dragon, MD 04/16/2015 11:13 AM    CC:  PATIENT NAME:  Heather, Harmon MR#: [id number]

## 2015-04-16 NOTE — Significant Event (Signed)
1100Am-Neo drip primed and in line but has not been started; ordered to start to keep MAP >65.   Patient SBP 90-120, with MAP > 65. Patient tolerated EGD well-VS stable. Patient back on RA; family at bedside.

## 2015-04-16 NOTE — Significant Event (Signed)
Patient prepped for EGD-Neo drip available if needed. However, BP stable with MAP > 65. Spoke with GI PA & MD Olevia Perches.

## 2015-04-16 NOTE — Progress Notes (Signed)
Daily Rounding Note  04/16/2015, 8:24 AM  LOS: 2 days   SUBJECTIVE:       Started on neo for hypotension.  One medium sized, maroon stool at 0200.  Pt feels fine, denies pain, nausea, SOB, chest pain.   OBJECTIVE:         Vital signs in last 24 hours:    Temp:  [97.2 F (36.2 C)-98.4 F (36.9 C)] 98.2 F (36.8 C) (07/22 0800) Pulse Rate:  [29-140] 86 (07/22 0800) Resp:  [15-32] 23 (07/22 0800) BP: (82-123)/(41-85) 123/83 mmHg (07/22 0800) SpO2:  [91 %-100 %] 94 % (07/22 0800) Weight:  [143 lb 11.8 oz (65.2 kg)] 143 lb 11.8 oz (65.2 kg) (07/22 0500) Last BM Date: 04/16/15 Filed Weights   04/14/15 2300 04/16/15 0500  Weight: 133 lb 2.5 oz (60.4 kg) 143 lb 11.8 oz (65.2 kg)   General: Alert, cachectic looking face.  Scleral icterus   Heart: RRR.  No mrg.  S1/s2 audible Chest: clear bil.  Breathing quietly, no cough Abdomen: soft, NT, ND.  No mass.  BS active.   Extremities: no CCE Neuro/Psych:  Pleasant, oriented x 3.  Fully alert and appropriate. Very polite and calm.   Intake/Output from previous day: 07/21 0701 - 07/22 0700 In: 4367.9 [I.V.:1776.3; Blood:1941.7; IV Piggyback:650] Out: 2035 [Urine:2035]  Intake/Output this shift: Total I/O In: 375 [I.V.:75; IV Piggyback:300] Out: -   Lab Results:  Recent Labs  04/15/15 0750 04/15/15 1646 04/16/15 0127  WBC 19.3* 16.5* 16.9*  HGB 6.6* 7.8* 9.0*  HCT 19.6* 22.7* 25.7*  PLT 288 274 267   BMET  Recent Labs  04/15/15 0750 04/15/15 1330 04/16/15 0127  NA 135 138 138  K 3.2* 2.8* 3.0*  CL 109 108 102  CO2 12* 11* 19*  GLUCOSE 86 85 103*  BUN 94* 82* 81*  CREATININE 6.32* 6.24* 6.08*  CALCIUM 7.7* 8.1* 8.0*   LFT  Recent Labs  04/15/15 0126 04/16/15 0127  PROT 5.3* 5.6*  ALBUMIN 1.7* 2.1*  AST 114* 72*  ALT 79* 60*  ALKPHOS 682* 542*  BILITOT 40.8* 42.2*   PT/INR  Recent Labs  04/15/15 1330 04/16/15 0127  LABPROT 15.6* 14.2    INR 1.23 1.08   Hepatitis Panel  Recent Labs  04/15/15 1646  HCVAB <0.1    Studies/Results: Dg Chest Port 1 View  04/15/2015   CLINICAL DATA:  Acute onset of generalized shortness of breath and weakness. Initial encounter.  EXAM: PORTABLE CHEST - 1 VIEW  COMPARISON:  None.  FINDINGS: The lungs are well-aerated. Minimal right basilar density is thought to reflect overlying soft tissues. There is no evidence of focal opacification, pleural effusion or pneumothorax.  The cardiomediastinal silhouette is within normal limits. No acute osseous abnormalities are seen.  IMPRESSION: No acute cardiopulmonary process seen.   Electronically Signed   By: Garald Balding M.D.   On: 04/15/2015 01:51   Scheduled Meds: . sodium chloride   Intravenous Once  . sodium chloride   Intravenous Once  . sodium chloride   Intravenous Once  . pantoprazole (PROTONIX) IV  40 mg Intravenous Q12H  . piperacillin-tazobactam (ZOSYN)  IV  2.25 g Intravenous Q8H  . potassium chloride  10 mEq Intravenous Q1 Hr x 4   Continuous Infusions: . phenylephrine (NEO-SYNEPHRINE) Adult infusion    .  sodium bicarbonate  infusion 1000 mL 75 mL/hr at 04/15/15 2000   PRN Meds:.sodium chloride, metoprolol   ASSESMENT:   *  Kl[place]kin tumor with obstructive jaundice.  CA 19-9 8868.  T Bili climbing while other LFTs improved.  Leukocytosis essentially stable. May need perc biliary drainage vs ERCP. On Zosyn.   * GIB.  Now on pressor for hypotension though clinically stable at present. Just one episode maroon stool early this AM, after similar stool at home PTA.  ?Erosion of tumor into SB?  On BID IV Protonix.  EGD set for this AM.  Hgb up from 6.6 to 9.0.   * Normocytic anemia. Some of the drop may be due to volume resuscitation. 1 unit PRBC 7/21.   * Coagulopathy. corrected. S/p 6 FFP and vitamin K  * AKI.Improved.   *  Runs of SVT. Now on Neo due to hypotension.     PLAN   *  EGD today.  Decide on ERCP vs perc  drain based on EGD findings.     Heather Harmon  04/16/2015, 8:24 AM Pager: [phone] Attending MD note:   I have taken a history, examined the patient, and reviewed the chart. I agree with the Advanced Practitioner's impression and recommendations.   Melburn Popper [facility] Pager # [phone] Please see EGD note

## 2015-04-16 NOTE — Consult Note (Signed)
Chief Complaint: Patient was seen in consultation today for jaundice at the request of Dr Olevia Perches  Referring Physician(s): Dr Delfin Edis  History of Present Illness: Heather Harmon is a 69 y.o. female   Pt admitted for abd pain off/on 1 mo. Progressive jaundice Bilirubin 40 on admission Now 33 Consulted Dr Olevia Perches Endoscopy today reveals:  ENDOSCOPIC IMPRESSION: 1. The mucosa of the esophagus appeared normal 2. The mucosa of the stomach appeared normal 3. Half circumferential mass was found in the peri-ampullary area and 2nd part duodenum; multiple biopsies and brushings were performed mass is friable and exophytic. Consistent with the carcinoma.,rule out primary pancreatic. Versus ampullary versus duodenal, mass is not obstructing yet  Dr Olevia Perches has requested percutaneous transhepatic cholangiogram with drain placement. Dr Kathlene Cote has reviewed imaging and approves procedure I have seen and examined pt  History reviewed. No pertinent past medical history.  History reviewed. No pertinent past surgical history.  Allergies: Review of patient's allergies indicates no known allergies.  Medications: Prior to Admission medications   Not on File     History reviewed. No pertinent family history.  History   Social History  . Marital Status: Single    Spouse Name: N/A  . Number of Children: N/A  . Years of Education: N/A   Occupational History  . 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 Smoking status: Former Research scientist (life sciences)  . Smokeless tobacco: Not on file  . Alcohol Use: Not on file  . Drug Use: Not on file  . Sexual Activity: Not on file   Other Topics Concern  . None   Social History Narrative     Review of Systems: A 12 point ROS discussed and pertinent positives are indicated in the HPI above.  All other systems are negative.  Review of Systems  Constitutional: Positive for activity  change, appetite change, fatigue and unexpected weight change. Negative for fever.  Respiratory: Positive for shortness of breath.   Gastrointestinal: Positive for abdominal pain and blood in stool.  Musculoskeletal: Negative for back pain.  Neurological: Positive for weakness.  Psychiatric/Behavioral: Negative for behavioral problems and confusion.    Vital Signs: BP 100/58 mmHg  Pulse 87  Temp(Src) 98.2 F (36.8 C) (Oral)  Resp 19  Ht 5' 5"  (1.651 m)  Wt 143 lb 11.8 oz (65.2 kg)  BMI 23.92 kg/m2  SpO2 100%  Physical Exam  Constitutional: She is oriented to person, place, and time.  Eyes:  Jaundice   Cardiovascular: Normal rate and regular rhythm.   Pulmonary/Chest: Effort normal and breath sounds normal.  Abdominal: Soft. Bowel sounds are normal.  Musculoskeletal: Normal range of motion.  Neurological: She is alert and oriented to person, place, and time.  Skin: Skin is warm.  Psychiatric: She has a normal mood and affect. Her behavior is normal. Judgment and thought content normal.  Consented pt with good understanding Sisters at bedside Sister signed consent also  Nursing note and vitals reviewed.   Mallampati Score:  MD Evaluation Airway: WNL Heart: WNL Abdomen: WNL Chest/ Lungs: WNL Other Pertinent Findings: jaundiced ASA  Classification: 3 Mallampati/Airway Score: One  Imaging: Dg Chest Port 1 View  04/15/2015   CLINICAL DATA:  Acute onset of generalized shortness of breath and weakness. Initial encounter.  EXAM: PORTABLE CHEST - 1 VIEW  COMPARISON:  None.  FINDINGS: The lungs are well-aerated. Minimal right basilar density  is thought to reflect overlying soft tissues. There is no evidence of focal opacification, pleural effusion or pneumothorax.  The cardiomediastinal silhouette is within normal limits. No acute osseous abnormalities are seen.  IMPRESSION: No acute cardiopulmonary process seen.   Electronically Signed   By: Garald Balding M.D.   On: 04/15/2015  01:51    Labs:  CBC:  Recent Labs  04/15/15 0126 04/15/15 0750 04/15/15 1646 04/16/15 0127  WBC 17.6* 19.3* 16.5* 16.9*  HGB 8.5* 6.6* 7.8* 9.0*  HCT 25.7* 19.6* 22.7* 25.7*  PLT 337 288 274 267    COAGS:  Recent Labs  04/15/15 0126 04/15/15 1330 04/15/15 1646 04/16/15 0127  INR 5.78* 1.23  --  1.08  APTT  --   --  26  --     BMP:  Recent Labs  04/15/15 0126 04/15/15 0750 04/15/15 1330 04/16/15 0127  NA 130* 135 138 138  K 3.2* 3.2* 2.8* 3.0*  CL 102 109 108 102  CO2 11* 12* 11* 19*  GLUCOSE 99 86 85 103*  BUN 100* 94* 82* 81*  CALCIUM 7.8* 7.7* 8.1* 8.0*  CREATININE 7.44* 6.32* 6.24* 6.08*  GFRNONAA 5* 6* 6* 6*  GFRAA 6* 7* 7* 7*    LIVER FUNCTION TESTS:  Recent Labs  04/15/15 0126 04/16/15 0127  BILITOT 40.8* 42.2*  AST 114* 72*  ALT 79* 60*  ALKPHOS 682* 542*  PROT 5.3* 5.6*  ALBUMIN 1.7* 2.1*    TUMOR MARKERS:  Recent Labs  04/15/15 1330  [id number]*    Assessment and Plan:  Jaundice abd pain Duodenal mass; path pending per Endo Now scheduled for PTC with biliary drain placement Pt and family aware of procedure benefits and risks including but not limited to: Infection.; bleeding; organ damage; damage to surrounding structures Agreeable to proceed Consent signed andin chart  Thank you for this interesting consult.  I greatly enjoyed meeting Heather Harmon and look forward to participating in their care.  A copy of this report was sent to the requesting provider on this date.  Signed: Eulah Walkup A 04/16/2015, 1:19 PM   I spent a total of 40 Minutes    in face to face in clinical consultation, greater than 50% of which was counseling/coordinating care for PTC/drain

## 2015-04-16 NOTE — Progress Notes (Signed)
PULMONARY / CRITICAL CARE MEDICINE   Name: Heather Harmon MRN: [id number] DOB: 08-14-1946    ADMISSION DATE:  04/14/2015 CONSULTATION DATE:  04/14/2015  REFERRING MD :  Oval Linsey EDP  CHIEF COMPLAINT:  Abd pain  INITIAL PRESENTATION: 69 year old female presented to [facility] ED c/o abdominal pain and yellow skin. 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 Also noted to be have AKI with SCr 8. Transferred to [facility] for further eval.  STUDIES:  CT abd/pelvis 7/20 > Significant intrahepatic and proximal extrahepatic biliary dilation and questionably obstructing mass lesion at the CBD concerning for cholangiocarcinoma. Contracted gallbladder containing high attenuation material. Enlarged perihepatic lymph node. US abdomen 7/20 > Results from [place] not available at this time.  SIGNIFICANT EVENTS:  HISTORY OF PRESENT ILLNESS:  69 year old female with no known medical history reports "yellow eyes" for about one month now. About 3-4 days ago she began to have SOB with even minimal exertion which was progressively getting worse. 7/19 late PM she had several episodes of vomiting, and 7/20 she had weakness with multiple falls. She also had several loose stools that were noted to be bloody with clots by family. For these reasons she presented to the ER 7/20. Given her presentation she was sent for a CT scan of the abdomen and pelvis, the results of which are described above. 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 While in ED she developed sinus tachycardia with rates up to the 160s, which resolved with IV metoprolol. She had an abdominal ultrasound as well, however, those results are unavailable to me at this time. She was transferred to [facility] for further evaluation.    SUBJECTIVE: denies abd pain Feels better Afebrile Good UO  VITAL  SIGNS: Temp:  [97.2 F (36.2 C)-98.4 F (36.9 C)] 98.4 F (36.9 C) (07/22 0357) Pulse Rate:  [29-140] 80 (07/22 0600) Resp:  [15-32] 18 (07/22 0600) BP: (82-121)/(41-85) 82/49 mmHg (07/22 0600) SpO2:  [91 %-100 %] 100 % (07/22 0600) Weight:  [143 lb 11.8 oz (65.2 kg)] 143 lb 11.8 oz (65.2 kg) (07/22 0500) HEMODYNAMICS:   VENTILATOR SETTINGS:   INTAKE / OUTPUT:  Intake/Output Summary (Last 24 hours) at 04/16/15 6294 Last data filed at 04/16/15 0600  Gross per 24 hour  Intake 4392.92 ml  Output   1585 ml  Net 2807.92 ml    PHYSICAL EXAMINATION: General:  Thin female in NAD Neuro:  Alert, oriented, non-focal HEENT:  [place]/AT, jaundiced sclerae, PERRL, no JVD Cardiovascular:  Tachy, regular, no MRG noted Lungs:  Diminished bases Abdomen: Soft, non-tender, non-distended Musculoskeletal:  No acute deformity or ROM limitation, no Edema Skin: Grossly intact, jaundiced.   LABS:  CBC  Recent Labs Lab 04/15/15 0750 04/15/15 1646 04/16/15 0127  WBC 19.3* 16.5* 16.9*  HGB 6.6* 7.8* 9.0*  HCT 19.6* 22.7* 25.7*  PLT 288 274 267   Coag's  Recent Labs Lab 04/15/15 0126 04/15/15 1330 04/15/15 1646 04/16/15 0127  APTT  --   --  26  --   INR 5.78* 1.23  --  1.08   BMET  Recent Labs Lab 04/15/15 0750 04/15/15 1330 04/16/15 0127  NA 135 138 138  K 3.2* 2.8* 3.0*  CL 109 108 102  CO2 12* 11* 19*  BUN 94* 82* 81*  CREATININE 6.32* 6.24* 6.08*  GLUCOSE 86 85 103*   Electrolytes  Recent Labs Lab 04/15/15 0126 04/15/15 0750  04/15/15 1330 04/16/15 0127  CALCIUM 7.8* 7.7* 8.1* 8.0*  MG 1.2* 1.6*  --   --   PHOS 7.1* 6.4*  --   --    Sepsis Markers  Recent Labs Lab 04/15/15 0126  LATICACIDVEN 1.0  PROCALCITON 5.65   ABG No results for input(s): PHART, PCO2ART, PO2ART in the last 168 hours. Liver Enzymes  Recent Labs Lab 04/15/15 0126 04/16/15 0127  AST 114* 72*  ALT 79* 60*  ALKPHOS 682* 542*  BILITOT 40.8* 42.2*  ALBUMIN 1.7* 2.1*   Cardiac  Enzymes  Recent Labs Lab 04/15/15 0126 04/15/15 0750 04/15/15 1330  TROPONINI 0.10* 0.05* 0.05*   Glucose No results for input(s): GLUCAP in the last 168 hours.  Imaging No results found.   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 RLL PNA  P:   BCx2 7/21 >>> UC  7/21 >>>  Abx: pip/tazo, start date 7/21 >>> Add azithro if CAP on CXR PCT  ENDOCRINE A:   No acute issues  P:   Follow glucose on chemistry  NEUROLOGIC A:   No acute issues  P:   Monitor   FAMILY  - Updates: sister 7/21  - Inter-disciplinary family meet or Palliative Care meeting due by: 7/28  Summary - Await definitive diagnosis of obstructive jaundice, EGD planned to see if ERCP/stent possible or will need percutaneous approach. Hope renal failure will improve  The patient is critically ill with multiple organ systems failure and requires high complexity decision making for assessment and support, frequent evaluation and titration of therapies, application of advanced monitoring technologies and extensive interpretation of multiple  databases. Critical Care Time devoted to patient care services described in this note independent of APP time is 35 minutes.   Kara Mead MD. [place] Flood. [facility] Pager [phone] If no response call [phone]   04/16/2015 6:35 AM

## 2015-04-16 NOTE — Progress Notes (Signed)
  Subjective: No abdominal pain, NPO for EGD  Objective: Vital signs in last 24 hours: Temp:  [97.2 F (36.2 C)-98.4 F (36.9 C)] 98.4 F (36.9 C) (07/22 0357) Pulse Rate:  [29-140] 85 (07/22 0700) Resp:  [15-32] 19 (07/22 0700) BP: (82-121)/(41-85) 104/51 mmHg (07/22 0700) SpO2:  [91 %-100 %] 100 % (07/22 0700) Weight:  [65.2 kg (143 lb 11.8 oz)] 65.2 kg (143 lb 11.8 oz) (07/22 0500) Last BM Date: 04/16/15  Intake/Output from previous day: 07/21 0701 - 07/22 0700 In: 4367.9 [I.V.:1776.3; Blood:1941.7; IV Piggyback:650] Out: 2035 [Urine:2035] Intake/Output this shift: Total I/O In: 300 [IV Piggyback:300] Out: -   General appearance: alert and cooperative Resp: clear to auscultation bilaterally Cardio: regular rate and rhythm GI: soft, NT, ND Scleral icterus  Lab Results:   Recent Labs  04/15/15 1646 04/16/15 0127  WBC 16.5* 16.9*  HGB 7.8* 9.0*  HCT 22.7* 25.7*  PLT 274 267   BMET  Recent Labs  04/15/15 1330 04/16/15 0127  NA 138 138  K 2.8* 3.0*  CL 108 102  CO2 11* 19*  GLUCOSE 85 103*  BUN 82* 81*  CREATININE 6.24* 6.08*  CALCIUM 8.1* 8.0*   PT/INR  Recent Labs  04/15/15 1330 04/16/15 0127  LABPROT 15.6* 14.2  INR 1.23 1.08   ABG No results for input(s): PHART, HCO3 in the last 72 hours.  Invalid input(s): PCO2, PO2  Studies/Results: Dg Chest Port 1 View  04/15/2015   CLINICAL DATA:  Acute onset of generalized shortness of breath and weakness. Initial encounter.  EXAM: PORTABLE CHEST - 1 VIEW  COMPARISON:  None.  FINDINGS: The lungs are well-aerated. Minimal right basilar density is thought to reflect overlying soft tissues. There is no evidence of focal opacification, pleural effusion or pneumothorax.  The cardiomediastinal silhouette is within normal limits. No acute osseous abnormalities are seen.  IMPRESSION: No acute cardiopulmonary process seen.   Electronically Signed   By: Garald Balding M.D.   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

## 2015-04-17 DIAGNOSIS — R17 Unspecified jaundice: Secondary | ICD-10-CM | POA: Insufficient documentation

## 2015-04-17 DIAGNOSIS — K831 Obstruction of bile duct: Secondary | ICD-10-CM | POA: Insufficient documentation

## 2015-04-17 LAB — PROTIME-INR
INR: 1.12 (ref 0.00–1.49)
Prothrombin Time: 14.6 seconds (ref 11.6–15.2)

## 2015-04-17 LAB — CBC
HCT: 23.6 % — ABNORMAL LOW (ref 36.0–46.0)
HEMOGLOBIN: 8.3 g/dL — AB (ref 12.0–15.0)
MCH: 30.5 pg (ref 26.0–34.0)
MCHC: 35.2 g/dL (ref 30.0–36.0)
MCV: 86.8 fL (ref 78.0–100.0)
Platelets: 244 10*3/uL (ref 150–400)
RBC: 2.72 MIL/uL — ABNORMAL LOW (ref 3.87–5.11)
RDW: 16.4 % — ABNORMAL HIGH (ref 11.5–15.5)
WBC: 21.1 10*3/uL — ABNORMAL HIGH (ref 4.0–10.5)

## 2015-04-17 LAB — COMPREHENSIVE METABOLIC PANEL
ALT: 49 U/L (ref 14–54)
AST: 63 U/L — ABNORMAL HIGH (ref 15–41)
Albumin: 1.8 g/dL — ABNORMAL LOW (ref 3.5–5.0)
Alkaline Phosphatase: 562 U/L — ABNORMAL HIGH (ref 38–126)
Anion gap: 14 (ref 5–15)
BUN: 68 mg/dL — ABNORMAL HIGH (ref 6–20)
CO2: 25 mmol/L (ref 22–32)
Calcium: 7.9 mg/dL — ABNORMAL LOW (ref 8.9–10.3)
Chloride: 98 mmol/L — ABNORMAL LOW (ref 101–111)
Creatinine, Ser: 5.52 mg/dL — ABNORMAL HIGH (ref 0.44–1.00)
GFR calc Af Amer: 8 mL/min — ABNORMAL LOW (ref >60)
GFR, EST NON AFRICAN AMERICAN: 7 mL/min — AB (ref >60)
Glucose, Bld: 149 mg/dL — ABNORMAL HIGH (ref 65–99)
Potassium: 2.8 mmol/L — ABNORMAL LOW (ref 3.5–5.1)
SODIUM: 137 mmol/L (ref 135–145)
Total Bilirubin: 38.5 mg/dL (ref 0.3–1.2)
Total Protein: 5.6 g/dL — ABNORMAL LOW (ref 6.5–8.1)

## 2015-04-17 LAB — HEPATITIS B SURFACE ANTIGEN: HEP B S AG: NEGATIVE

## 2015-04-17 MED ORDER — PIPERACILLIN-TAZOBACTAM IN DEX 2-0.25 GM/50ML IV SOLN
2.2500 g | Freq: Three times a day (TID) | INTRAVENOUS | Status: DC
  Administered 2015-04-17 – 2015-04-21 (×12): 2.25 g via INTRAVENOUS
  Filled 2015-04-17 (×15): qty 50

## 2015-04-17 MED ORDER — PANTOPRAZOLE SODIUM 40 MG PO TBEC
40.0000 mg | DELAYED_RELEASE_TABLET | Freq: Every day | ORAL | Status: DC
  Administered 2015-04-17 – 2015-04-22 (×6): 40 mg via ORAL
  Filled 2015-04-17 (×6): qty 1

## 2015-04-17 MED ORDER — POTASSIUM CHLORIDE 10 MEQ/100ML IV SOLN
10.0000 meq | INTRAVENOUS | Status: AC
  Administered 2015-04-17 (×6): 10 meq via INTRAVENOUS
  Filled 2015-04-17 (×6): qty 100

## 2015-04-17 MED ORDER — SODIUM CHLORIDE 0.9 % IV SOLN
INTRAVENOUS | Status: DC
  Administered 2015-04-17: 75 mL via INTRAVENOUS
  Administered 2015-04-18 – 2015-04-22 (×5): via INTRAVENOUS

## 2015-04-17 MED ORDER — SACCHAROMYCES BOULARDII 250 MG PO CAPS
250.0000 mg | ORAL_CAPSULE | Freq: Two times a day (BID) | ORAL | Status: DC
  Administered 2015-04-17 – 2015-04-22 (×11): 250 mg via ORAL
  Filled 2015-04-17 (×13): qty 1

## 2015-04-17 NOTE — Progress Notes (Signed)
Daily Rounding Note  04/17/2015, 8:21 AM  LOS: 3 days   SUBJECTIVE:   550 cc drained so far from perc drain.  Pt denies pain and nausea.  Appetite poor but likes the clears.      Frequent medium brown, loose to watery stools, no melena.  C diff pcr at noon yest was negative.  RN requesting flexiseal.   OBJECTIVE:         Vital signs in last 24 hours:    Temp:  [97.6 F (36.4 C)-98.9 F (37.2 C)] 97.6 F (36.4 C) (07/23 0400) Pulse Rate:  [25-105] 90 (07/23 0600) Resp:  [1-32] 11 (07/23 0600) BP: (85-136)/(50-90) 99/54 mmHg (07/23 0600) SpO2:  [97 %-100 %] 99 % (07/23 0600) Weight:  [136 lb 11 oz (62 kg)] 136 lb 11 oz (62 kg) (07/23 0600) Last BM Date: 04/17/15 Filed Weights   04/14/15 2300 04/16/15 0500 04/17/15 0600  Weight: 133 lb 2.5 oz (60.4 kg) 143 lb 11.8 oz (65.2 kg) 136 lb 11 oz (62 kg)   General: looks comfortable, not acutely ill, just thin   Heart: RRR in 90s Chest: clear bil.  No dyspnea or cough Abdomen: soft, NT, brown/clear/bilious/non-purulent drainage from perc tube.  BS present.  ND.    Extremities: no CCE Neuro/Psych:  Pleasant, oriented x 3.  Fully alert and follows all commands. No gross deficits.   Intake/Output from previous day: 07/22 0701 - 07/23 0700 In: 3420 [I.V.:1860; IV Piggyback:1150] Out: 3675 [Urine:2475; Drains:550; Stool:650]  Intake/Output this shift:    Lab Results:  Recent Labs  04/15/15 1646 04/16/15 0127 04/17/15 0234  WBC 16.5* 16.9* 21.1*  HGB 7.8* 9.0* 8.3*  HCT 22.7* 25.7* 23.6*  PLT 274 267 244   BMET  Recent Labs  04/16/15 0127 04/16/15 1414 04/17/15 0234  NA 138 140 137  K 3.0* 3.4* 2.8*  CL 102 106 98*  CO2 19* 19* 25  GLUCOSE 103* 93 149*  BUN 81* 78* 68*  CREATININE 6.08* 6.03* 5.52*  CALCIUM 8.0* 8.1* 7.9*   LFT  Recent Labs  04/15/15 0126 04/16/15 0127 04/16/15 1414 04/17/15 0234  PROT 5.3* 5.6*  --  5.6*  ALBUMIN 1.7* 2.1* 2.0*  1.8*  AST 114* 72*  --  63*  ALT 79* 60*  --  49  ALKPHOS 682* 542*  --  562*  BILITOT 40.8* 42.2*  --  38.5*   PT/INR  Recent Labs  04/16/15 0127 04/17/15 0234  LABPROT 14.2 14.6  INR 1.08 1.12   Hepatitis Panel  Recent Labs  04/15/15 1646 04/16/15 0127  HEPBSAG  --  Negative  HCVAB <0.1  --     Studies/Results: Ir Biliary Drain Placement With Cholangiogram  04/16/2015   INDICATION: High-grade biliary obstruction with bilirubin of 42 and endoscopy demonstrating 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 ERCP directed biliary stent placement was not possible and the patient now presents for percutaneous biliary drainage.  EXAM: ULTRASOUND AND FLUOROSCOPIC GUIDED PERCUTANEOUS TRANSHEPATIC CHOLANGIOGRAM AND BILIARY DRAINAGE CATHETER PLACEMENT  COMPARISON:  None.  MEDICATIONS: 2.0 mg IV Versed sec, 100 mcg IV fentanyl. A scheduled dose of 2.25 g IV Zosyn as well as 400 mg IV Cipro were administered.  CONTRAST:  30 mL OMNIPAQUE IOHEXOL 300 MG/ML  SOLN  ANESTHESIA/SEDATION: Total Moderate Sedation Time  30 minutes  FLUOROSCOPY TIME:  4 minutes and 49 seconds.  COMPLICATIONS: None  TECHNIQUE: Informed written consent was obtained from [facility], [place]  after a discussion of the risks, benefits and alternatives to treatment. Questions regarding the procedure were encouraged and answered. A timeout was performed prior to the initiation of the procedure.  The right upper abdominal quadrant was prepped and draped in the usual sterile fashion, and a sterile drape was applied covering the operative field. Maximum barrier sterile technique with sterile gowns and gloves were used for the procedure.  Ultrasound scanning of the right upper abdominal quadrant was performed to delineate the anatomy and avoid transgression of the gallbladder or pleura. Local anesthesia was provided with 1% lidocaine. Under direct ultrasound guidance, a 21 gauge needle was advanced into a right lobe  intrahepatic duct. Contrast injection was performed and cholangiography performed of the intrahepatic biliary tree.  A guidewire was advanced through the needle and an Accustick dilator advanced into the central bile ducts. The Accustick catheter was exchanged for a Kumpe catheter over a [employer] wire. With the use of a regular glide wire, the Kumpe catheter was advanced beyond the level of the common bile duct obstruction and into the duodenum.  Over an Amplatz stiff wire, the tract was dilated and a 10.2 [place] biliary drainage catheter was advanced with coil ultimately locked within the duodenum. Contrast was injected and a completion radiograph was obtained. The catheter was connected to a drainage bag which yielded the brisk return of clear bile. The catheter was secured to the skin with an interrupted suture and StatLock device. The patient tolerated the procedure well without immediate postprocedural complication.  FINDINGS: Cholangiography demonstrates a high-grade biliary obstruction with massively dilated intrahepatic and central bile ducts present. There was beaking noted at the level of the common bile duct with significant obstruction present over the distal segment of the CBD. Based on appearance, there may be more than just an ampullary process causing biliary stricture which extends higher than expected for ampullary involvement alone.  After crossing the obstruction, the biliary drainage catheter was able to be formed in the duodenum with sideholes extending up into the central biliary tree. The catheter will be left to gravity drainage to allow bilirubin to decrease. Ultimately, after pending tissue biopsy results from endoscopic biopsies, the common bile duct stricture may be able to be treated with an internalized permanent biliary stent from current percutaneous access.  IMPRESSION: Cholangiogram confirms the presence of a high-grade biliary obstruction with obstruction at the level of the common  bile duct. As above, the stricture of the common bile duct appears to be fairly long and there may be fairly extensive tumor causing constriction of the common bile duct. 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 However, a pancreatic neoplasm may also be present and additional characterization with MRI may be helpful. A 10 French internal/external biliary drainage catheter was able to be placed percutaneously across the level of biliary obstruction and into the duodenum. This will be left to gravity drainage. The common bile duct stricture may be amenable to internalized permanent biliary stent placement once the biliary tree has been allowed to decompress and pending endoscopic biopsy results.   Electronically Signed   By: Aletta Edouard M.D.   On: 04/16/2015 18:50   Scheduled Meds: . sodium chloride   Intravenous Once  . sodium chloride   Intravenous Once  . sodium chloride   Intravenous Once  . pantoprazole (PROTONIX) IV  40 mg Intravenous Q12H  . potassium chloride  10 mEq Intravenous Q1 Hr x 6  . saccharomyces boulardii  250 mg Oral BID  Continuous Infusions: . sodium chloride 10 mL/hr at 04/16/15 2045  . sodium chloride    . phenylephrine (NEO-SYNEPHRINE) Adult infusion     PRN Meds:.sodium chloride, metoprolol  ASSESMENT:   *  UGIB EGD 7/22: friable, exophytic, cancerous appearing, peri-ampullary mass was biopsied and is source of bleeding.   No hemostatic therapy.  BP remains low.   *   Ampulllary, distal CBD mass.  S/p perc cholangiogram 7/22. Dr Kathlene Cote placed internal/external biliary drain. Plan to place perc covered stent in several days/a week. CA 19-9 elevated, CEA not yet collected.   *  AKI.  No concept of baseline.  Hypokalemia.     *  ABL anemia, no concept of baseline.  S/p PRBC x   *  Coagulopathy.  No further FFP or vit K following initial doses.   *  Hep B surface Ab +, hep B surface Ag negative:  immunity. HCV and HIV negative.  *  Diarrhea, suspect due  to zosyn.  WBCs  21K, no fever, urine clx with multiple species, 7/21 cxr negative for PNA/infiltrate,  No growth to date on 7/21 blood clxs.     PLAN   *  Await pathology.  Will need oncology input, consult can wait until Monday.  *  ? switch to different abx or stop altogether.   Will allow flexiseal for now.    *  Solids diet.  Switch to daily oral PPI (vs stop altogether given etiology of bleeding and PPI common s/e of diarrhea)  *  Drain mgt per IR.  Will sign off.  Call if questions.     Azucena Freed  04/17/2015, 8:21 AM Pager: [phone]  Attending MD note:   I have taken a history, examined the patient, and reviewed the chart.Extrahepatic biliary obstruction  By a duodenal mass, biopsies pending, not sure if primary  pancreatic- Ca 19-9 is > 8868. Bili down to 38.0. Will follow till oncology sees.  Melburn Popper [facility] Pager # [phone]

## 2015-04-17 NOTE — Progress Notes (Signed)
Subjective: Interval History: has no complaint.  Objective: Vital signs in last 24 hours: Temp:  [97.6 F (36.4 C)-98.9 F (37.2 C)] 97.6 F (36.4 C) (07/23 0400) Pulse Rate:  [25-105] 90 (07/23 0600) Resp:  [1-32] 11 (07/23 0600) BP: (85-136)/(50-90) 99/54 mmHg (07/23 0600) SpO2:  [94 %-100 %] 99 % (07/23 0600) Weight:  [62 kg (136 lb 11 oz)] 62 kg (136 lb 11 oz) (07/23 0600) Weight change: -3.2 kg (-7 lb 0.9 oz)  Intake/Output from previous day: 07/22 0701 - 07/23 0700 In: 3420 [I.V.:1860; IV Piggyback:1150] Out: 5284 [Urine:2475; Drains:550; Stool:650] Intake/Output this shift:    General appearance: alert, cooperative and cachectic Resp: clear to auscultation bilaterally Cardio: regular rate and rhythm, S1, S2 normal and systolic murmur: holosystolic 2/6, blowing at apex GI: pos bs, RUQ drain in biliary tree. Extremities: extremities normal, atraumatic, no cyanosis or edema  Lab Results:  Recent Labs  04/16/15 0127 04/17/15 0234  WBC 16.9* 21.1*  HGB 9.0* 8.3*  HCT 25.7* 23.6*  PLT 267 244   BMET:  Recent Labs  04/16/15 1414 04/17/15 0234  NA 140 137  K 3.4* 2.8*  CL 106 98*  CO2 19* 25  GLUCOSE 93 149*  BUN 78* 68*  CREATININE 6.03* 5.52*  CALCIUM 8.1* 7.9*   No results for input(s): PTH in the last 72 hours. Iron Studies: No results for input(s): IRON, TIBC, TRANSFERRIN, FERRITIN in the last 72 hours.  Studies/Results: Ir Biliary Drain Placement With Cholangiogram  04/16/2015   INDICATION: High-grade biliary obstruction with bilirubin of 42 and endoscopy demonstrating 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 ERCP directed biliary stent placement was not possible and the patient now presents for percutaneous biliary drainage.  EXAM: ULTRASOUND AND FLUOROSCOPIC GUIDED PERCUTANEOUS TRANSHEPATIC CHOLANGIOGRAM AND BILIARY DRAINAGE CATHETER PLACEMENT  COMPARISON:  None.  MEDICATIONS: 2.0 mg IV Versed sec, 100 mcg IV fentanyl. A scheduled  dose of 2.25 g IV Zosyn as well as 400 mg IV Cipro were administered.  CONTRAST:  30 mL OMNIPAQUE IOHEXOL 300 MG/ML  SOLN  ANESTHESIA/SEDATION: Total Moderate Sedation Time  30 minutes  FLUOROSCOPY TIME:  4 minutes and 49 seconds.  COMPLICATIONS: None  TECHNIQUE: Informed written consent was obtained from [facility], [place] A after a discussion of the risks, benefits and alternatives to treatment. Questions regarding the procedure were encouraged and answered. A timeout was performed prior to the initiation of the procedure.  The right upper abdominal quadrant was prepped and draped in the usual sterile fashion, and a sterile drape was applied covering the operative field. Maximum barrier sterile technique with sterile gowns and gloves were used for the procedure.  Ultrasound scanning of the right upper abdominal quadrant was performed to delineate the anatomy and avoid transgression of the gallbladder or pleura. Local anesthesia was provided with 1% lidocaine. Under direct ultrasound guidance, a 21 gauge needle was advanced into a right lobe intrahepatic duct. Contrast injection was performed and cholangiography performed of the intrahepatic biliary tree.  A guidewire was advanced through the needle and an Accustick dilator advanced into the central bile ducts. The Accustick catheter was exchanged for a Kumpe catheter over a [employer] wire. With the use of a regular glide wire, the Kumpe catheter was advanced beyond the level of the common bile duct obstruction and into the duodenum.  Over an Amplatz stiff wire, the tract was dilated and a 10.2 [place] biliary drainage catheter was advanced with coil ultimately locked within the duodenum. Contrast was injected and a completion  radiograph was obtained. The catheter was connected to a drainage bag which yielded the brisk return of clear bile. The catheter was secured to the skin with an interrupted suture and StatLock device. The patient tolerated the procedure well without  immediate postprocedural complication.  FINDINGS: Cholangiography demonstrates a high-grade biliary obstruction with massively dilated intrahepatic and central bile ducts present. There was beaking noted at the level of the common bile duct with significant obstruction present over the distal segment of the CBD. Based on appearance, there may be more than just an ampullary process causing biliary stricture which extends higher than expected for ampullary involvement alone.  After crossing the obstruction, the biliary drainage catheter was able to be formed in the duodenum with sideholes extending up into the central biliary tree. The catheter will be left to gravity drainage to allow bilirubin to decrease. Ultimately, after pending tissue biopsy results from endoscopic biopsies, the common bile duct stricture may be able to be treated with an internalized permanent biliary stent from current percutaneous access.  IMPRESSION: Cholangiogram confirms the presence of a high-grade biliary obstruction with obstruction at the level of the common bile duct. As above, the stricture of the common bile duct appears to be fairly long and there may be fairly extensive tumor causing constriction of the common bile duct. 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 However, a pancreatic neoplasm may also be present and additional characterization with MRI may be helpful. A 10 French internal/external biliary drainage catheter was able to be placed percutaneously across the level of biliary obstruction and into the duodenum. This will be left to gravity drainage. The common bile duct stricture may be amenable to internalized permanent biliary stent placement once the biliary tree has been allowed to decompress and pending endoscopic biopsy results.   Electronically Signed   By: Aletta Edouard M.D.   On: 04/16/2015 18:50    I have reviewed the patient's current medications.  Assessment/Plan: 1 AKI slowly better, no concept of a  baseline.  Acid/base stable, use Cl containing solution, hopefully to help K needs.  Nonoliguric 2 Biliary obstruction 3 low K 4 Malnutrition P iv NS, drain , ?stent, follow chem    LOS: 3 days   Malaina Mortellaro L 04/17/2015,7:55 AM

## 2015-04-17 NOTE — Progress Notes (Signed)
[facility] Progress Note Patient Name: Heather Harmon DOB: Mar 20, 1946 MRN: [id number]   Date of Service  04/17/2015  HPI/Events of Note  Hypokalemia  eICU Interventions  Potassium replaced     Intervention Category Major Interventions: Electrolyte abnormality - evaluation and management  DETERDING,ELIZABETH 04/17/2015, 3:57 AM

## 2015-04-17 NOTE — Progress Notes (Addendum)
PULMONARY / CRITICAL CARE MEDICINE   Name: Heather Harmon MRN: [id number] DOB: Nov 01, 1945    ADMISSION DATE:  04/14/2015 CONSULTATION DATE:  04/14/2015  REFERRING MD :  Oval Linsey EDP  CHIEF COMPLAINT:  Abd pain  INITIAL PRESENTATION: 69 year old female presented to [facility] ED c/o abdominal pain and yellow skin. 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 Also noted to be have AKI with SCr 8. Transferred to [facility] for further eval.  STUDIES:  CT abd/pelvis 7/20 > Significant intrahepatic and proximal extrahepatic biliary dilation and questionably obstructing mass lesion at the CBD concerning for cholangiocarcinoma. Contracted gallbladder containing high attenuation material. Enlarged perihepatic lymph node. US abdomen 7/20 > Results from [place] not available at this time. 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 About 3-4 days ago she began to have SOB with even minimal exertion which was progressively getting worse. 7/19 late PM she had several episodes of vomiting, and 7/20 she had weakness with multiple falls. She also had several loose stools that were noted to be bloody with clots by family. For these reasons she presented to the ER 7/20. Given her presentation she was sent for a CT scan of the abdomen and pelvis, the results of which are described above. 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 While in ED she developed sinus tachycardia with rates up to the 160s, which resolved with IV metoprolol. She had an abdominal ultrasound as well, however, those results are unavailable to me at this time. She was transferred to [facility] for further evaluation.     SUBJECTIVE / Interval events:  Doing well s/p drain placement Took PO this am Having diarrhea,    VITAL SIGNS: Temp:  [97.6 F (36.4 C)-98.9 F (37.2 C)] 97.6 F (36.4 C) (07/23 0400) Pulse Rate:  [25-105] 90 (07/23 0600) Resp:  [1-32] 11 (07/23 0600) BP: (85-136)/(50-90) 99/54 mmHg (07/23 0600) SpO2:  [97 %-100 %] 99 % (07/23 0600) Weight:  [62 kg (136 lb 11 oz)] 62 kg (136 lb 11 oz) (07/23 0600) HEMODYNAMICS:   VENTILATOR SETTINGS:   INTAKE / OUTPUT:  Intake/Output Summary (Last 24 hours) at 04/17/15 0856 Last data filed at 04/17/15 0700  Gross per 24 hour  Intake   3045 ml  Output   3575 ml  Net   -530 ml    PHYSICAL EXAMINATION: General:  Thin female in NAD, up to chair Neuro:  Alert, oriented, non-focal HEENT:  [place]/AT, jaundiced sclerae, PERRL, no JVD Cardiovascular:  Tachy, regular, no MRG noted Lungs:  Diminished bases Abdomen: Soft, non-tender, non-distended, drain in place with blood-tinged bile Musculoskeletal:  No acute deformity or ROM limitation, no Edema Skin: Grossly intact, jaundiced.   LABS:  CBC  Recent Labs Lab 04/15/15 1646 04/16/15 0127 04/17/15 0234  WBC 16.5* 16.9* 21.1*  HGB 7.8* 9.0* 8.3*  HCT 22.7* 25.7* 23.6*  PLT 274 267 244   Coag's  Recent Labs Lab 04/15/15 1330 04/15/15 1646 04/16/15 0127 04/17/15 0234  APTT  --  26  --   --   INR 1.23  --  1.08 1.12   BMET  Recent Labs Lab 04/16/15 0127 04/16/15 1414 04/17/15 0234  NA 138 140 137  K 3.0* 3.4* 2.8*  CL 102  106 98*  CO2 19* 19* 25  BUN 81* 78* 68*  CREATININE 6.08* 6.03* 5.52*  GLUCOSE 103* 93 149*   Electrolytes  Recent Labs Lab 04/15/15 0126 04/15/15 0750  04/16/15 0127 04/16/15 1414 04/17/15 0234  CALCIUM 7.8* 7.7*  < > 8.0* 8.1* 7.9*  MG 1.2* 1.6*  --   --   --   --   PHOS 7.1* 6.4*  --   --  5.2*  --   < > = values in this interval not displayed. Sepsis Markers  Recent Labs Lab 04/15/15 0126  LATICACIDVEN 1.0  PROCALCITON 5.65    ABG No results for input(s): PHART, PCO2ART, PO2ART in the last 168 hours. Liver Enzymes  Recent Labs Lab 04/15/15 0126 04/16/15 0127 04/16/15 1414 04/17/15 0234  AST 114* 72*  --  63*  ALT 79* 60*  --  49  ALKPHOS 682* 542*  --  562*  BILITOT 40.8* 42.2*  --  38.5*  ALBUMIN 1.7* 2.1* 2.0* 1.8*   Cardiac Enzymes  Recent Labs Lab 04/15/15 0126 04/15/15 0750 04/15/15 1330  TROPONINI 0.10* 0.05* 0.05*   Glucose No results for input(s): GLUCAP in the last 168 hours.  Imaging Ir Biliary Drain Placement With Cholangiogram  04/16/2015   INDICATION: High-grade biliary obstruction with bilirubin of 42 and endoscopy demonstrating 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 ERCP directed biliary stent placement was not possible and the patient now presents for percutaneous biliary drainage.  EXAM: ULTRASOUND AND FLUOROSCOPIC GUIDED PERCUTANEOUS TRANSHEPATIC CHOLANGIOGRAM AND BILIARY DRAINAGE CATHETER PLACEMENT  COMPARISON:  None.  MEDICATIONS: 2.0 mg IV Versed sec, 100 mcg IV fentanyl. A scheduled dose of 2.25 g IV Zosyn as well as 400 mg IV Cipro were administered.  CONTRAST:  30 mL OMNIPAQUE IOHEXOL 300 MG/ML  SOLN  ANESTHESIA/SEDATION: Total Moderate Sedation Time  30 minutes  FLUOROSCOPY TIME:  4 minutes and 49 seconds.  COMPLICATIONS: None  TECHNIQUE: Informed written consent was obtained from [facility], Shunda A after a discussion of the risks, benefits and alternatives to treatment. Questions regarding the procedure were encouraged and answered. A timeout was performed prior to the initiation of the procedure.  The right upper abdominal quadrant was prepped and draped in the usual sterile fashion, and a sterile drape was applied covering the operative field. Maximum barrier sterile technique with sterile gowns and gloves were used for the procedure.  Ultrasound scanning of the right upper abdominal quadrant was performed to delineate the anatomy and avoid transgression of  the gallbladder or pleura. Local anesthesia was provided with 1% lidocaine. Under direct ultrasound guidance, a 21 gauge needle was advanced into a right lobe intrahepatic duct. Contrast injection was performed and cholangiography performed of the intrahepatic biliary tree.  A guidewire was advanced through the needle and an Accustick dilator advanced into the central bile ducts. The Accustick catheter was exchanged for a Kumpe catheter over a [employer] wire. With the use of a regular glide wire, the Kumpe catheter was advanced beyond the level of the common bile duct obstruction and into the duodenum.  Over an Amplatz stiff wire, the tract was dilated and a 10.2 [place] biliary drainage catheter was advanced with coil ultimately locked within the duodenum. Contrast was injected and a completion radiograph was obtained. The catheter was connected to a drainage bag which yielded the brisk return of clear bile. The catheter was secured to the skin with an interrupted suture and StatLock device. The patient tolerated the procedure well without immediate postprocedural complication.  FINDINGS: Cholangiography demonstrates a high-grade biliary obstruction with massively dilated intrahepatic and central bile ducts present. There was beaking noted at the level of the common bile duct with significant obstruction present over the distal segment of the CBD. Based on appearance, there may be more than just an ampullary process causing biliary stricture which extends higher than expected for ampullary involvement alone.  After crossing the obstruction, the biliary drainage catheter was able to be formed in the duodenum with sideholes extending up into the central biliary tree. The catheter will be left to gravity drainage to allow bilirubin to decrease. Ultimately, after pending tissue biopsy results from endoscopic biopsies, the common bile duct stricture may be able to be treated with an internalized permanent biliary stent from  current percutaneous access.  IMPRESSION: Cholangiogram confirms the presence of a high-grade biliary obstruction with obstruction at the level of the common bile duct. As above, the stricture of the common bile duct appears to be fairly long and there may be fairly extensive tumor causing constriction of the common bile duct. 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 However, a pancreatic neoplasm may also be present and additional characterization with MRI may be helpful. A 10 French internal/external biliary drainage catheter was able to be placed percutaneously across the level of biliary obstruction and into the duodenum. This will be left to gravity drainage. The common bile duct stricture may be amenable to internalized permanent biliary stent placement once the biliary tree has been allowed to decompress and pending endoscopic biopsy results.   Electronically Signed   By: Aletta Edouard M.D.   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, path pending  UGIB, almost certainly due to mass Hepatic failure Elevated bilirubin, obstructive jaundice  P:   Await path post EGD, will need to involve oncology once tissue dx obtained Will need either stent or less likely surgery (may not be indicated in this setting), IR and CCS following Advance diet Change PPI to PO Trend LFT flexiseal placed am 7/23, goal remove as soon as feasible  HEMATOLOGIC/ONC A:    Coagulopathy in setting hepatic disease - corrected with vit K 7 FFP Acute blood loss anemia - s/p 1U PRBC 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 Following Acute renal  failure for improvement with reversal sepsis  Will transfer her to telemetry bed on 7/23, ask [facility] to assume her care as of 7/24.   The patient is critically ill with multiple organ systems failure and requires high complexity decision making for assessment and support, frequent evaluation and titration of therapies, application of advanced monitoring technologies and extensive interpretation of multiple databases. Critical Care Time devoted to patient care services described in this note independent of APP time is 35 minutes.     Baltazar Apo, MD, PhD 04/17/2015, 9:01 AM [facility] [phone] or if no answer [phone]

## 2015-04-17 NOTE — Progress Notes (Signed)
Referring Physician(s): Dr. Olevia Perches GI  Chief Complaint: High grade biliary obstruction and obstructing ampullary mass by EGD s/p Percutaneous biliary drainage with cholangiography 7/22  Subjective: Patient denies any abdominal pain, she denies any nausea or vomiting.   Allergies: Review of patient's allergies indicates no known allergies.  Medications: Prior to Admission medications   Not on File    Vital Signs: BP 100/44 mmHg  Pulse 89  Temp(Src) 97.6 F (36.4 C) (Oral)  Resp 26  Ht 5\' 5"  (1.651 m)  Wt 136 lb 11 oz (62 kg)  BMI 22.75 kg/m2  SpO2 100%  Physical Exam General: A&Ox3, NAD, scleral icterus  Abd: Soft, NT, ND, Biliary drain intact with clear brown/bilious output 150cc in bag, 550cc/24 hrs  Imaging: Dg Chest Port 1 View  04/15/2015   CLINICAL DATA:  Acute onset of generalized shortness of breath and weakness. Initial encounter.  EXAM: PORTABLE CHEST - 1 VIEW  COMPARISON:  None.  FINDINGS: The lungs are well-aerated. Minimal right basilar density is thought to reflect overlying soft tissues. There is no evidence of focal opacification, pleural effusion or pneumothorax.  The cardiomediastinal silhouette is within normal limits. No acute osseous abnormalities are seen.  IMPRESSION: No acute cardiopulmonary process seen.   Electronically Signed   By: Garald Balding M.D.   On: 04/15/2015 01:51   Ir Biliary Drain Placement With Cholangiogram  04/16/2015   INDICATION: High-grade biliary obstruction with bilirubin of 42 and endoscopy demonstrating 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 ERCP directed biliary stent placement was not possible and the patient now presents for percutaneous biliary drainage.  EXAM: ULTRASOUND AND FLUOROSCOPIC GUIDED PERCUTANEOUS TRANSHEPATIC CHOLANGIOGRAM AND BILIARY DRAINAGE CATHETER PLACEMENT  COMPARISON:  None.  MEDICATIONS: 2.0 mg IV Versed sec, 100 mcg IV fentanyl. A scheduled dose of 2.25 g IV Zosyn as well as 400 mg  IV Cipro were administered.  CONTRAST:  30 mL OMNIPAQUE IOHEXOL 300 MG/ML  SOLN  ANESTHESIA/SEDATION: Total Moderate Sedation Time  30 minutes  FLUOROSCOPY TIME:  4 minutes and 49 seconds.  COMPLICATIONS: None  TECHNIQUE: Informed written consent was obtained from [facility], Chianna A after a discussion of the risks, benefits and alternatives to treatment. Questions regarding the procedure were encouraged and answered. A timeout was performed prior to the initiation of the procedure.  The right upper abdominal quadrant was prepped and draped in the usual sterile fashion, and a sterile drape was applied covering the operative field. Maximum barrier sterile technique with sterile gowns and gloves were used for the procedure.  Ultrasound scanning of the right upper abdominal quadrant was performed to delineate the anatomy and avoid transgression of the gallbladder or pleura. Local anesthesia was provided with 1% lidocaine. Under direct ultrasound guidance, a 21 gauge needle was advanced into a right lobe intrahepatic duct. Contrast injection was performed and cholangiography performed of the intrahepatic biliary tree.  A guidewire was advanced through the needle and an Accustick dilator advanced into the central bile ducts. The Accustick catheter was exchanged for a Kumpe catheter over a [employer] wire. With the use of a regular glide wire, the Kumpe catheter was advanced beyond the level of the common bile duct obstruction and into the duodenum.  Over an Amplatz stiff wire, the tract was dilated and a 10.2 [place] biliary drainage catheter was advanced with coil ultimately locked within the duodenum. Contrast was injected and a completion radiograph was obtained. The catheter was connected to a drainage bag which yielded the brisk return of clear  bile. The catheter was secured to the skin with an interrupted suture and StatLock device. The patient tolerated the procedure well without immediate postprocedural complication.   FINDINGS: Cholangiography demonstrates a high-grade biliary obstruction with massively dilated intrahepatic and central bile ducts present. There was beaking noted at the level of the common bile duct with significant obstruction present over the distal segment of the CBD. Based on appearance, there may be more than just an ampullary process causing biliary stricture which extends higher than expected for ampullary involvement alone.  After crossing the obstruction, the biliary drainage catheter was able to be formed in the duodenum with sideholes extending up into the central biliary tree. The catheter will be left to gravity drainage to allow bilirubin to decrease. Ultimately, after pending tissue biopsy results from endoscopic biopsies, the common bile duct stricture may be able to be treated with an internalized permanent biliary stent from current percutaneous access.  IMPRESSION: Cholangiogram confirms the presence of a high-grade biliary obstruction with obstruction at the level of the common bile duct. As above, the stricture of the common bile duct appears to be fairly long and there may be fairly extensive tumor causing constriction of the common bile duct. 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 However, a pancreatic neoplasm may also be present and additional characterization with MRI may be helpful. A 10 French internal/external biliary drainage catheter was able to be placed percutaneously across the level of biliary obstruction and into the duodenum. This will be left to gravity drainage. The common bile duct stricture may be amenable to internalized permanent biliary stent placement once the biliary tree has been allowed to decompress and pending endoscopic biopsy results.   Electronically Signed   By: Aletta Edouard M.D.   On: 04/16/2015 18:50    Labs:  CBC:  Recent Labs  04/15/15 0750 04/15/15 1646 04/16/15 0127 04/17/15 0234  WBC 19.3* 16.5* 16.9* 21.1*  HGB 6.6* 7.8* 9.0* 8.3*    HCT 19.6* 22.7* 25.7* 23.6*  PLT 288 274 267 244    COAGS:  Recent Labs  04/15/15 0126 04/15/15 1330 04/15/15 1646 04/16/15 0127 04/17/15 0234  INR 5.78* 1.23  --  1.08 1.12  APTT  --   --  26  --   --     BMP:  Recent Labs  04/15/15 1330 04/16/15 0127 04/16/15 1414 04/17/15 0234  NA 138 138 140 137  K 2.8* 3.0* 3.4* 2.8*  CL 108 102 106 98*  CO2 11* 19* 19* 25  GLUCOSE 85 103* 93 149*  BUN 82* 81* 78* 68*  CALCIUM 8.1* 8.0* 8.1* 7.9*  CREATININE 6.24* 6.08* 6.03* 5.52*  GFRNONAA 6* 6* 6* 7*  GFRAA 7* 7* 8* 8*    LIVER FUNCTION TESTS:  Recent Labs  04/15/15 0126 04/16/15 0127 04/16/15 1414 04/17/15 0234  BILITOT 40.8* 42.2*  --  38.5*  AST 114* 72*  --  63*  ALT 79* 60*  --  49  ALKPHOS 682* 542*  --  562*  PROT 5.3* 5.6*  --  5.6*  ALBUMIN 1.7* 2.1* 2.0* 1.8*    Assessment and Plan: High grade biliary obstruction and obstructing ampullary mass by EGD s/p Percutaneous biliary drainage with cholangiography 7/22 Good bilious output, T. Bili trending down 38.5 (42.2), wbc trend up-afebrile/not tachycardic, H/H stable, vitals stable- follow labs and output Once biliary tree decompressed for several days/week, the distal obstruction should be amenable to percutaneous covered stent placement. Await pathology.    SignedLilia Pro,  Luretha Eberly D 04/17/2015, 11:44 AM   I spent a total of 15 Minutes in face to face in clinical consultation/evaluation, greater than 50% of which was counseling/coordinating care for biliary obstruction.

## 2015-04-18 DIAGNOSIS — N179 Acute kidney failure, unspecified: Secondary | ICD-10-CM

## 2015-04-18 DIAGNOSIS — D684 Acquired coagulation factor deficiency: Secondary | ICD-10-CM

## 2015-04-18 DIAGNOSIS — E876 Hypokalemia: Secondary | ICD-10-CM

## 2015-04-18 DIAGNOSIS — D490 Neoplasm of unspecified behavior of digestive system: Secondary | ICD-10-CM

## 2015-04-18 DIAGNOSIS — N170 Acute kidney failure with tubular necrosis: Secondary | ICD-10-CM

## 2015-04-18 DIAGNOSIS — K831 Obstruction of bile duct: Secondary | ICD-10-CM

## 2015-04-18 LAB — CBC
HCT: 24.4 % — ABNORMAL LOW (ref 36.0–46.0)
Hemoglobin: 8.3 g/dL — ABNORMAL LOW (ref 12.0–15.0)
MCH: 29.9 pg (ref 26.0–34.0)
MCHC: 34 g/dL (ref 30.0–36.0)
MCV: 87.8 fL (ref 78.0–100.0)
Platelets: 188 10*3/uL (ref 150–400)
RBC: 2.78 MIL/uL — ABNORMAL LOW (ref 3.87–5.11)
RDW: 16.2 % — ABNORMAL HIGH (ref 11.5–15.5)
WBC: 24.7 10*3/uL — ABNORMAL HIGH (ref 4.0–10.5)

## 2015-04-18 LAB — COMPREHENSIVE METABOLIC PANEL
ALT: 35 U/L (ref 14–54)
AST: 32 U/L (ref 15–41)
Albumin: 1.7 g/dL — ABNORMAL LOW (ref 3.5–5.0)
Alkaline Phosphatase: 439 U/L — ABNORMAL HIGH (ref 38–126)
Anion gap: 12 (ref 5–15)
BUN: 51 mg/dL — ABNORMAL HIGH (ref 6–20)
CALCIUM: 8 mg/dL — AB (ref 8.9–10.3)
CHLORIDE: 99 mmol/L — AB (ref 101–111)
CO2: 25 mmol/L (ref 22–32)
CREATININE: 4.8 mg/dL — AB (ref 0.44–1.00)
GFR, EST AFRICAN AMERICAN: 10 mL/min — AB (ref >60)
GFR, EST NON AFRICAN AMERICAN: 9 mL/min — AB (ref >60)
Glucose, Bld: 112 mg/dL — ABNORMAL HIGH (ref 65–99)
Potassium: 3.2 mmol/L — ABNORMAL LOW (ref 3.5–5.1)
Sodium: 136 mmol/L (ref 135–145)
Total Bilirubin: 24.8 mg/dL (ref 0.3–1.2)
Total Protein: 5.6 g/dL — ABNORMAL LOW (ref 6.5–8.1)

## 2015-04-18 LAB — TSH: TSH: 0.294 u[IU]/mL — ABNORMAL LOW (ref 0.350–4.500)

## 2015-04-18 LAB — PHOSPHORUS: Phosphorus: 4.6 mg/dL (ref 2.5–4.6)

## 2015-04-18 MED ORDER — LOPERAMIDE HCL 2 MG PO CAPS
2.0000 mg | ORAL_CAPSULE | ORAL | Status: DC | PRN
  Administered 2015-04-18: 2 mg via ORAL
  Filled 2015-04-18: qty 1

## 2015-04-18 MED ORDER — LOPERAMIDE HCL 2 MG PO CAPS
2.0000 mg | ORAL_CAPSULE | Freq: Four times a day (QID) | ORAL | Status: DC
  Administered 2015-04-18 – 2015-04-22 (×16): 2 mg via ORAL
  Filled 2015-04-18 (×16): qty 1

## 2015-04-18 MED ORDER — HEPARIN SODIUM (PORCINE) 5000 UNIT/ML IJ SOLN
5000.0000 [IU] | Freq: Three times a day (TID) | INTRAMUSCULAR | Status: DC
  Administered 2015-04-18 – 2015-04-22 (×12): 5000 [IU] via SUBCUTANEOUS
  Filled 2015-04-18 (×13): qty 1

## 2015-04-18 MED ORDER — POTASSIUM CHLORIDE CRYS ER 20 MEQ PO TBCR
40.0000 meq | EXTENDED_RELEASE_TABLET | ORAL | Status: AC
  Administered 2015-04-18 (×2): 40 meq via ORAL
  Filled 2015-04-18 (×2): qty 2

## 2015-04-18 MED ORDER — MAGNESIUM SULFATE 2 GM/50ML IV SOLN
2.0000 g | Freq: Once | INTRAVENOUS | Status: AC
  Administered 2015-04-18: 2 g via INTRAVENOUS
  Filled 2015-04-18: qty 50

## 2015-04-18 NOTE — Consult Note (Signed)
Reason for Referral: Common bile duct mass.   HPI: 69 year old woman currently of [place], [place] where she lives with her niece. She has number of family members in [place] as well as [place]. She gets her routine medical care with her PCP in [place]. She presented on 04/14/2015 with symptoms of abdominal pain and jaundice and imaging studies showed intrahepatic and proximal extrahepatic biliary dilation obstructing mass at the CBD concerning for cholangiocarcinoma. She had a bilirubin of 40 with acute renal failure and a creatinine of 7.44. She was transferred to [facility] and underwent percutaneous biliary drain after cholangiography on 04/16/2015. EGD on 04/16/2015 showed a friable, exophytic mass on the periampullary lesion that was biopsied and the results are currently pending. Clinically, she have noticed improvement in the last few days her appetite reporting to be better. She does not report any abdominal pain, nausea or vomiting. Her jaundice is improving.  She is not reporting any headaches, blurry vision, syncope or seizures. Does not report any fevers, chills, sweats does report weight loss and appetite changes. She does not report any chest pain, palpitation, orthopnea or leg edema. Does not report any cough or hemoptysis or hematemesis. She does not report any frequency, urgency or hesitancy. She does report discoloration of her urine. She does not report any skeletal complaints. Remaining review of systems unremarkable.    Her past medical history is unremarkable for coronary disease, diabetes or hypertension.   Current facility-administered medications:  .  0.9 %  sodium chloride infusion, 250 mL, Intravenous, PRN, Corey Harold, NP .  0.9 %  sodium chloride infusion, , Intravenous, Continuous, Mauricia Area, MD, Last Rate: 75 mL/hr at 04/18/15 0217 .  loperamide (IMODIUM) capsule 2 mg, 2 mg, Oral, Q4H PRN, Thurnell Lose, MD .  magnesium sulfate IVPB 2 g 50 mL, 2 g, Intravenous,  Once, Thurnell Lose, MD .  metoprolol (LOPRESSOR) injection 2.5-5 mg, 2.5-5 mg, Intravenous, Q3H PRN, Colbert Coyer, MD .  pantoprazole (PROTONIX) EC tablet 40 mg, 40 mg, Oral, Q0600, Vena Rua, PA-C, 40 mg at 04/18/15 6579 .  piperacillin-tazobactam (ZOSYN) IVPB 2.25 g, 2.25 g, Intravenous, Q8H, Charlynne Cousins Gribbin, PA-C, 2.25 g at 04/18/15 0383 .  potassium chloride SA (K-DUR,KLOR-CON) CR tablet 40 mEq, 40 mEq, Oral, Q4H, Thurnell Lose, MD .  saccharomyces boulardii (FLORASTOR) capsule 250 mg, 250 mg, Oral, BID, Vena Rua, PA-C, 250 mg at 04/17/15 2231:  No Known Allergies:  History reviewed. No pertinent family history.:  History   Social History  . Marital Status: Single    Spouse Name: N/A  . Number of Children: N/A  . Years of Education: N/A   Occupational History  . 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 Smoking status: Former Research scientist (life sciences)  . Smokeless tobacco: Not on file  . Alcohol Use: Not on file  . Drug Use: Not on file  . Sexual Activity: Not on file   Other Topics Concern  . Not on file   Social History Narrative  :  Pertinent items are noted in HPI.  Exam: Blood pressure 115/72, pulse 88, temperature 97.9 F (36.6 C), temperature source Oral, resp. rate 18, height 5\' 5"  (1.651 m), weight 136 lb 11 oz (62 kg), SpO2 100 %. General appearance: alert and cooperative. Chronically ill-appearing woman. Head: Normocephalic, without obvious abnormality. Sclerae icteric  Throat: lips, mucosa, and tongue normal;  teeth and gums normal Neck: no adenopathy Back: negative Resp: clear to auscultation bilaterally Chest wall: no tenderness Cardio: regular rate and rhythm, S1, S2 normal, no murmur, click, rub or gallop GI: soft, non-tender; bowel sounds normal; no masses,  no organomegaly Extremities: extremities normal, atraumatic, no cyanosis or edema Pulses: 2+ and symmetric Skin:  Skin color, texture, turgor normal. No rashes or lesions Lymph nodes: Cervical, supraclavicular, and axillary nodes normal.   Recent Labs  04/17/15 0234 04/18/15 0442  WBC 21.1* 24.7*  HGB 8.3* 8.3*  HCT 23.6* 24.4*  PLT 244 188    Recent Labs  04/17/15 0234 04/18/15 0442  NA 137 136  K 2.8* 3.2*  CL 98* 99*  CO2 25 25  GLUCOSE 149* 112*  BUN 68* 51*  CREATININE 5.52* 4.80*  CALCIUM 7.9* 8.0*     Dg Chest Port 1 View  04/15/2015   CLINICAL DATA:  Acute onset of generalized shortness of breath and weakness. Initial encounter.  EXAM: PORTABLE CHEST - 1 VIEW  COMPARISON:  None.  FINDINGS: The lungs are well-aerated. Minimal right basilar density is thought to reflect overlying soft tissues. There is no evidence of focal opacification, pleural effusion or pneumothorax.  The cardiomediastinal silhouette is within normal limits. No acute osseous abnormalities are seen.  IMPRESSION: No acute cardiopulmonary process seen.   Electronically Signed   By: Garald Balding M.D.   On: 04/15/2015 01:51   Ir Biliary Drain Placement With Cholangiogram  04/16/2015   INDICATION: High-grade biliary obstruction with bilirubin of 42 and endoscopy demonstrating 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 ERCP directed biliary stent placement was not possible and the patient now presents for percutaneous biliary drainage.  EXAM: ULTRASOUND AND FLUOROSCOPIC GUIDED PERCUTANEOUS TRANSHEPATIC CHOLANGIOGRAM AND BILIARY DRAINAGE CATHETER PLACEMENT  COMPARISON:  None.  MEDICATIONS: 2.0 mg IV Versed sec, 100 mcg IV fentanyl. A scheduled dose of 2.25 g IV Zosyn as well as 400 mg IV Cipro were administered.  CONTRAST:  30 mL OMNIPAQUE IOHEXOL 300 MG/ML  SOLN  ANESTHESIA/SEDATION: Total Moderate Sedation Time  30 minutes  FLUOROSCOPY TIME:  4 minutes and 49 seconds.  COMPLICATIONS: None  TECHNIQUE: Informed written consent was obtained from [facility], [place] A after a discussion of the risks, benefits and  alternatives to treatment. Questions regarding the procedure were encouraged and answered. A timeout was performed prior to the initiation of the procedure.  The right upper abdominal quadrant was prepped and draped in the usual sterile fashion, and a sterile drape was applied covering the operative field. Maximum barrier sterile technique with sterile gowns and gloves were used for the procedure.  Ultrasound scanning of the right upper abdominal quadrant was performed to delineate the anatomy and avoid transgression of the gallbladder or pleura. Local anesthesia was provided with 1% lidocaine. Under direct ultrasound guidance, a 21 gauge needle was advanced into a right lobe intrahepatic duct. Contrast injection was performed and cholangiography performed of the intrahepatic biliary tree.  A guidewire was advanced through the needle and an Accustick dilator advanced into the central bile ducts. The Accustick catheter was exchanged for a Kumpe catheter over a [employer] wire. With the use of a regular glide wire, the Kumpe catheter was advanced beyond the level of the common bile duct obstruction and into the duodenum.  Over an Amplatz stiff wire, the tract was dilated and a 10.2 [place] biliary drainage catheter was advanced with coil ultimately locked within the duodenum. Contrast was injected and a completion radiograph was obtained.  The catheter was connected to a drainage bag which yielded the brisk return of clear bile. The catheter was secured to the skin with an interrupted suture and StatLock device. The patient tolerated the procedure well without immediate postprocedural complication.  FINDINGS: Cholangiography demonstrates a high-grade biliary obstruction with massively dilated intrahepatic and central bile ducts present. There was beaking noted at the level of the common bile duct with significant obstruction present over the distal segment of the CBD. Based on appearance, there may be more than just an  ampullary process causing biliary stricture which extends higher than expected for ampullary involvement alone.  After crossing the obstruction, the biliary drainage catheter was able to be formed in the duodenum with sideholes extending up into the central biliary tree. The catheter will be left to gravity drainage to allow bilirubin to decrease. Ultimately, after pending tissue biopsy results from endoscopic biopsies, the common bile duct stricture may be able to be treated with an internalized permanent biliary stent from current percutaneous access.  IMPRESSION: Cholangiogram confirms the presence of a high-grade biliary obstruction with obstruction at the level of the common bile duct. As above, the stricture of the common bile duct appears to be fairly long and there may be fairly extensive tumor causing constriction of the common bile duct. 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 However, a pancreatic neoplasm may also be present and additional characterization with MRI may be helpful. A 10 French internal/external biliary drainage catheter was able to be placed percutaneously across the level of biliary obstruction and into the duodenum. This will be left to gravity drainage. The common bile duct stricture may be amenable to internalized permanent biliary stent placement once the biliary tree has been allowed to decompress and pending endoscopic biopsy results.   Electronically Signed   By: Aletta Edouard M.D.   On: 04/16/2015 18:50    Assessment and Plan:   69 year old woman with the following issues:  1. 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 She is status post EGD and a biopsy with the results are currently pending. Her cholangiogram confirm the presence of high grade obstruction at the level of the common bile duct indicating possibility for ampullary tumor as well.  These findings were discussed with the patient today and the ramifications of  potential malignancy were reviewed. Options of treatment in this particular setting would include surgical resection which necessitate an aggressive operation in the form of a Whipple procedure. It is unclear that her tumor is resectable and will need opinion from surgery regarding the question.  If her tumor is unresectable, palliative chemotherapy with radiation might be an alternative option.   The prognosis is relatively poor either way especially if she is unresectable.  My recommendation at this point is to continue supportive management as you're doing and I'll allow a period of recovery and we'll arrange outpatient follow-up to discuss treatment options down the line. This could be done at the [facility] in [place] or at [facility] based on the patient preference.  2. Obstructive jaundice: She status post percutaneous drainage which seems to be improving her bilirubin.  3. Coagulopathy: Likely related to vitamin K deficiency with coagulation parameters improving.  4. Acute renal failure: Her kidney function seems to be improving with good urine output.  We will arrange follow-up for her upon her discharge. Please call with questions in the meantime.

## 2015-04-18 NOTE — Progress Notes (Signed)
Subjective: Interval History: has no complaint .  Objective: Vital signs in last 24 hours: Temp:  [97.6 F (36.4 C)-98.5 F (36.9 C)] 97.9 F (36.6 C) (07/24 0650) Pulse Rate:  [81-95] 88 (07/24 0650) Resp:  [18-20] 18 (07/24 0650) BP: (87-115)/(63-72) 115/72 mmHg (07/24 0650) SpO2:  [97 %-100 %] 100 % (07/24 0650) Weight change:   Intake/Output from previous day: 07/23 0701 - 07/24 0700 In: 3112.5 [P.O.:1360; I.V.:1652.5; IV Piggyback:100] Out: 625 [Urine:175; Drains:450] Intake/Output this shift:    General appearance: alert, cooperative and no distress Resp: diminished breath sounds RLL and rales RLL Cardio: regular rate and rhythm, S1, S2 normal and systolic murmur: holosystolic 2/6, blowing at apex GI: drain RUQ, pos bs,firm RUQ Extremities: extremities normal, atraumatic, no cyanosis or edema  Lab Results:  Recent Labs  04/17/15 0234 04/18/15 0442  WBC 21.1* 24.7*  HGB 8.3* 8.3*  HCT 23.6* 24.4*  PLT 244 188   BMET:  Recent Labs  04/17/15 0234 04/18/15 0442  NA 137 136  K 2.8* 3.2*  CL 98* 99*  CO2 25 25  GLUCOSE 149* 112*  BUN 68* 51*  CREATININE 5.52* 4.80*  CALCIUM 7.9* 8.0*   No results for input(s): PTH in the last 72 hours. Iron Studies: No results for input(s): IRON, TIBC, TRANSFERRIN, FERRITIN in the last 72 hours.  Studies/Results: Ir Biliary Drain Placement With Cholangiogram  04/16/2015   INDICATION: High-grade biliary obstruction with bilirubin of 42 and endoscopy demonstrating 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 ERCP directed biliary stent placement was not possible and the patient now presents for percutaneous biliary drainage.  EXAM: ULTRASOUND AND FLUOROSCOPIC GUIDED PERCUTANEOUS TRANSHEPATIC CHOLANGIOGRAM AND BILIARY DRAINAGE CATHETER PLACEMENT  COMPARISON:  None.  MEDICATIONS: 2.0 mg IV Versed sec, 100 mcg IV fentanyl. A scheduled dose of 2.25 g IV Zosyn as well as 400 mg IV Cipro were administered.  CONTRAST:   30 mL OMNIPAQUE IOHEXOL 300 MG/ML  SOLN  ANESTHESIA/SEDATION: Total Moderate Sedation Time  30 minutes  FLUOROSCOPY TIME:  4 minutes and 49 seconds.  COMPLICATIONS: None  TECHNIQUE: Informed written consent was obtained from [facility], [place] A after a discussion of the risks, benefits and alternatives to treatment. Questions regarding the procedure were encouraged and answered. A timeout was performed prior to the initiation of the procedure.  The right upper abdominal quadrant was prepped and draped in the usual sterile fashion, and a sterile drape was applied covering the operative field. Maximum barrier sterile technique with sterile gowns and gloves were used for the procedure.  Ultrasound scanning of the right upper abdominal quadrant was performed to delineate the anatomy and avoid transgression of the gallbladder or pleura. Local anesthesia was provided with 1% lidocaine. Under direct ultrasound guidance, a 21 gauge needle was advanced into a right lobe intrahepatic duct. Contrast injection was performed and cholangiography performed of the intrahepatic biliary tree.  A guidewire was advanced through the needle and an Accustick dilator advanced into the central bile ducts. The Accustick catheter was exchanged for a Kumpe catheter over a [employer] wire. With the use of a regular glide wire, the Kumpe catheter was advanced beyond the level of the common bile duct obstruction and into the duodenum.  Over an Amplatz stiff wire, the tract was dilated and a 10.2 [place] biliary drainage catheter was advanced with coil ultimately locked within the duodenum. Contrast was injected and a completion radiograph was obtained. The catheter was connected to a drainage bag which yielded the brisk return of clear  bile. The catheter was secured to the skin with an interrupted suture and StatLock device. The patient tolerated the procedure well without immediate postprocedural complication.  FINDINGS: Cholangiography demonstrates a  high-grade biliary obstruction with massively dilated intrahepatic and central bile ducts present. There was beaking noted at the level of the common bile duct with significant obstruction present over the distal segment of the CBD. Based on appearance, there may be more than just an ampullary process causing biliary stricture which extends higher than expected for ampullary involvement alone.  After crossing the obstruction, the biliary drainage catheter was able to be formed in the duodenum with sideholes extending up into the central biliary tree. The catheter will be left to gravity drainage to allow bilirubin to decrease. Ultimately, after pending tissue biopsy results from endoscopic biopsies, the common bile duct stricture may be able to be treated with an internalized permanent biliary stent from current percutaneous access.  IMPRESSION: Cholangiogram confirms the presence of a high-grade biliary obstruction with obstruction at the level of the common bile duct. As above, the stricture of the common bile duct appears to be fairly long and there may be fairly extensive tumor causing constriction of the common bile duct. 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 However, a pancreatic neoplasm may also be present and additional characterization with MRI may be helpful. A 10 French internal/external biliary drainage catheter was able to be placed percutaneously across the level of biliary obstruction and into the duodenum. This will be left to gravity drainage. The common bile duct stricture may be amenable to internalized permanent biliary stent placement once the biliary tree has been allowed to decompress and pending endoscopic biopsy results.   Electronically Signed   By: Aletta Edouard M.D.   On: 04/16/2015 18:50    I have reviewed the patient's current medications.  Assessment/Plan: 1 AKI improving, nonoliguric, acid/base/K ok.  Can lower ivf, getting po K.  Hypoperfusion injury 2 Bile duct tumor  drain in, for stent 3 Malnutrition P lower ivf, follow chem , K    LOS: 4 days   Andrej Spagnoli L 04/18/2015,9:02 AM

## 2015-04-18 NOTE — Progress Notes (Signed)
Daily Rounding Note  04/18/2015, 9:32 AM  LOS: 4 days   SUBJECTIVE:       Stools continue bloody, watery stools.  flexiseal in place, output not being recorded.  Bile output 450 cc yesterday.  Appetite remains suppressed, 10% of meal intake.  No abd pain, no nausea.   OBJECTIVE:         Vital signs in last 24 hours:    Temp:  [97.6 F (36.4 C)-98.5 F (36.9 C)] 97.9 F (36.6 C) (07/24 0650) Pulse Rate:  [81-95] 88 (07/24 0650) Resp:  [18-20] 18 (07/24 0650) BP: (87-115)/(63-72) 115/72 mmHg (07/24 0650) SpO2:  [97 %-100 %] 100 % (07/24 0650) Last BM Date:  (04/18/15) Filed Weights   04/14/15 2300 04/16/15 0500 04/17/15 0600  Weight: 133 lb 2.5 oz (60.4 kg) 143 lb 11.8 oz (65.2 kg) 136 lb 11 oz (62 kg)   General: pleasant, thin.  Comfortable.    Heart: RRR Chest: clear bil Abdomen: soft, biliary drain on right with brown/bilious fluid: no blood   Extremities: no CCE Neuro/Psych:  orientented x 3.  Fully alert, relaxed and pleasant as always.    Intake/Output from previous day: 07/23 0701 - 07/24 0700 In: 3112.5 [P.O.:1360; I.V.:1652.5; IV Piggyback:100] Out: 625 [Urine:175; Drains:450]  Intake/Output this shift: Total I/O In: 118 [P.O.:118] Out: -   Lab Results:  Recent Labs  04/16/15 0127 04/17/15 0234 04/18/15 0442  WBC 16.9* 21.1* 24.7*  HGB 9.0* 8.3* 8.3*  HCT 25.7* 23.6* 24.4*  PLT 267 244 188   BMET  Recent Labs  04/16/15 1414 04/17/15 0234 04/18/15 0442  NA 140 137 136  K 3.4* 2.8* 3.2*  CL 106 98* 99*  CO2 19* 25 25  GLUCOSE 93 149* 112*  BUN 78* 68* 51*  CREATININE 6.03* 5.52* 4.80*  CALCIUM 8.1* 7.9* 8.0*   LFT  Recent Labs  04/16/15 0127 04/16/15 1414 04/17/15 0234 04/18/15 0442  PROT 5.6*  --  5.6* 5.6*  ALBUMIN 2.1* 2.0* 1.8* 1.7*  AST 72*  --  63* 32  ALT 60*  --  49 35  ALKPHOS 542*  --  562* 439*  BILITOT 42.2*  --  38.5* 24.8*   PT/INR  Recent Labs  04/16/15 0127 04/17/15 0234  LABPROT 14.2 14.6  INR 1.08 1.12   Hepatitis Panel  Recent Labs  04/15/15 1646 04/16/15 0127  HEPBSAG  --  Negative  HCVAB <0.1  --     Studies/Results: Ir Biliary Drain Placement With Cholangiogram  04/16/2015   INDICATION: High-grade biliary obstruction with bilirubin of 42 and endoscopy demonstrating 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 ERCP directed biliary stent placement was not possible and the patient now presents for percutaneous biliary drainage.  EXAM: ULTRASOUND AND FLUOROSCOPIC GUIDED PERCUTANEOUS TRANSHEPATIC CHOLANGIOGRAM AND BILIARY DRAINAGE CATHETER PLACEMENT  COMPARISON:  None.  MEDICATIONS: 2.0 mg IV Versed sec, 100 mcg IV fentanyl. A scheduled dose of 2.25 g IV Zosyn as well as 400 mg IV Cipro were administered.  CONTRAST:  30 mL OMNIPAQUE IOHEXOL 300 MG/ML  SOLN  ANESTHESIA/SEDATION: Total Moderate Sedation Time  30 minutes  FLUOROSCOPY TIME:  4 minutes and 49 seconds.  COMPLICATIONS: None  TECHNIQUE: Informed written consent was obtained from [facility], Ricquel A after a discussion of the risks, benefits and alternatives to treatment. Questions regarding the procedure were encouraged and answered. A timeout was performed prior to the initiation of the procedure.  The right upper abdominal  quadrant was prepped and draped in the usual sterile fashion, and a sterile drape was applied covering the operative field. Maximum barrier sterile technique with sterile gowns and gloves were used for the procedure.  Ultrasound scanning of the right upper abdominal quadrant was performed to delineate the anatomy and avoid transgression of the gallbladder or pleura. Local anesthesia was provided with 1% lidocaine. Under direct ultrasound guidance, a 21 gauge needle was advanced into a right lobe intrahepatic duct. Contrast injection was performed and cholangiography performed of the intrahepatic biliary tree.  A guidewire was advanced through the  needle and an Accustick dilator advanced into the central bile ducts. The Accustick catheter was exchanged for a Kumpe catheter over a [employer] wire. With the use of a regular glide wire, the Kumpe catheter was advanced beyond the level of the common bile duct obstruction and into the duodenum.  Over an Amplatz stiff wire, the tract was dilated and a 10.2 [place] biliary drainage catheter was advanced with coil ultimately locked within the duodenum. Contrast was injected and a completion radiograph was obtained. The catheter was connected to a drainage bag which yielded the brisk return of clear bile. The catheter was secured to the skin with an interrupted suture and StatLock device. The patient tolerated the procedure well without immediate postprocedural complication.  FINDINGS: Cholangiography demonstrates a high-grade biliary obstruction with massively dilated intrahepatic and central bile ducts present. There was beaking noted at the level of the common bile duct with significant obstruction present over the distal segment of the CBD. Based on appearance, there may be more than just an ampullary process causing biliary stricture which extends higher than expected for ampullary involvement alone.  After crossing the obstruction, the biliary drainage catheter was able to be formed in the duodenum with sideholes extending up into the central biliary tree. The catheter will be left to gravity drainage to allow bilirubin to decrease. Ultimately, after pending tissue biopsy results from endoscopic biopsies, the common bile duct stricture may be able to be treated with an internalized permanent biliary stent from current percutaneous access.  IMPRESSION: Cholangiogram confirms the presence of a high-grade biliary obstruction with obstruction at the level of the common bile duct. 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 However, a pancreatic neoplasm may also be present and additional characterization with MRI may be helpful. A 10 French internal/external biliary drainage catheter was able to be placed percutaneously across the level of biliary obstruction and into the duodenum. This will be left to gravity drainage. The common bile duct stricture may be amenable to internalized permanent biliary stent placement once the biliary tree has been allowed to decompress and pending endoscopic biopsy results.   Electronically Signed   By: Aletta Edouard M.D.   On: 04/16/2015 18:50   Scheduled Meds: . magnesium sulfate 1 - 4 g bolus IVPB  2 g Intravenous Once  . pantoprazole  40 mg Oral Q0600  . piperacillin-tazobactam (ZOSYN)  IV  2.25 g Intravenous Q8H  . potassium chloride  40 mEq Oral Q4H  . saccharomyces boulardii  250 mg Oral BID   Continuous Infusions: . sodium chloride 75 mL/hr at 04/18/15 0217   PRN Meds:.sodium chloride, loperamide, metoprolol  ASSESMENT:   * UGIB EGD 7/22: friable, exophytic, cancerous appearing, peri-ampullary mass was biopsied and is source of bleeding.  No hemostatic therapy. On once daily Protonix.    * Ampulllary, distal CBD mass. S/p perc cholangiogram 7/22. Dr Kathlene Cote placed internal/external biliary drain. Plan to place perc covered stent in several days/a week. LFTs improving daily. CA 19-9 elevated.   Dr Alen Blew, oncologist, saw pt today. Not clear tumor is resectable. Palliative chemo/radiation is also an option but overall prognosis is poor.   * AKI, nonoliguric.Improving. No concept of baseline. Hypokalemia.   *  Protein malnutrition.   * ABL anemia, no concept of baseline. S/p PRBC x 1.  Hgb drifting lower.   * Coagulopathy emains correcteFFP or vit K following initial doses.   * Hep B surface Ab +, hep B surface Ag negative: immunity. HCV and HIV negative.  * Diarrhea, ? due to zosyn.  WBCs rising to24K, no fever, urine clx with multiple species, 7/21 cxr negative for PNA/infiltrate, No growth to date on 7/21 blood clxs. ie:  No source for leukocytosis.  Got one dose of imodium today.    PLAN   *  Continue supportive care.   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    *  Await pathology from her endoscopic biopsy.   *  Switch to qid imodium.      Azucena Freed  04/18/2015, 9:32 AM Pager: [phone] Attending MD note:   I have taken a history, examined the patient, and reviewed the chart. I agree with the Advanced Practitioner's impression and recommendations. 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 Ca19-9 is 8000. Biopsies pending. I spoke with Dr Kathlene Cote, will wait with internalization ob the drain till bili back to normal or close to normal.  [facility] Pager # [phone]

## 2015-04-18 NOTE — Progress Notes (Signed)
ANTIBIOTIC CONSULT NOTE - FOLLOW UP  Pharmacy Consult for Zosyn  Indication: UTI and cholecystitis  No Known Allergies  Patient Measurements: Height: 5\' 5"  (165.1 cm) Weight: 143 lb 15.4 oz (65.3 kg) IBW/kg (Calculated) : 57  Vital Signs: Temp: 98.9 F (37.2 C) (07/24 1407) Temp Source: Oral (07/24 1407) BP: 153/135 mmHg (07/24 1407) Pulse Rate: 88 (07/24 1407) Intake/Output from previous day: 07/23 0701 - 07/24 0700 In: 3112.5 [P.O.:1360; I.V.:1652.5; IV Piggyback:100] Out: 625 [Urine:175; Drains:450] Intake/Output from this shift: Total I/O In: 236 [P.O.:236] Out: -   Labs:  Recent Labs  04/16/15 0127 04/16/15 1200 04/16/15 1414 04/17/15 0234 04/18/15 0442  WBC 16.9*  --   --  21.1* 24.7*  HGB 9.0*  --   --  8.3* 8.3*  PLT 267  --   --  244 188  LABCREA  --  44.91  --   --   --   CREATININE 6.08*  --  6.03* 5.52* 4.80*   Estimated Creatinine Clearance: 10.2 mL/min (by C-G formula based on Cr of 4.8). No results for input(s): VANCOTROUGH, VANCOPEAK, VANCORANDOM, GENTTROUGH, GENTPEAK, GENTRANDOM, TOBRATROUGH, TOBRAPEAK, TOBRARND, AMIKACINPEAK, AMIKACINTROU, AMIKACIN in the last 72 hours.   Microbiology: Recent Results (from the past 720 hour(s))  MRSA PCR Screening     Status: None   Collection Time: 04/14/15 10:56 PM  Result Value Ref Range Status   MRSA by PCR NEGATIVE NEGATIVE Final    Comment:        The GeneXpert MRSA Assay (FDA approved for NASAL specimens only), is one component of a comprehensive MRSA colonization surveillance program. It is not intended to diagnose MRSA infection nor to guide or monitor treatment for MRSA infections.   Urine culture     Status: None   Collection Time: 04/15/15 12:48 AM  Result Value Ref Range Status   Specimen Description URINE, CLEAN CATCH  Final   Special Requests NONE  Final   Culture MULTIPLE SPECIES PRESENT, SUGGEST RECOLLECTION  Final   Report Status 04/16/2015 FINAL  Final  Culture, blood (routine x  2)     Status: None (Preliminary result)   Collection Time: 04/15/15  1:26 AM  Result Value Ref Range Status   Specimen Description BLOOD RIGHT ANTECUBITAL  Final   Special Requests BOTTLES DRAWN AEROBIC AND ANAEROBIC 10CC EACH  Final   Culture NO GROWTH 3 DAYS  Final   Report Status PENDING  Incomplete  Culture, blood (routine x 2)     Status: None (Preliminary result)   Collection Time: 04/15/15  1:37 AM  Result Value Ref Range Status   Specimen Description BLOOD RIGHT FOREARM  Final   Special Requests BOTTLES DRAWN AEROBIC ONLY [place]  Final   Culture NO GROWTH 3 DAYS  Final   Report Status PENDING  Incomplete  Clostridium Difficile by PCR (not at [facility])     Status: None   Collection Time: 04/16/15 12:14 PM  Result Value Ref Range Status   C difficile by pcr NEGATIVE NEGATIVE Final    Anti-infectives    Start     Dose/Rate Route Frequency Ordered Stop   04/17/15 1500  piperacillin-tazobactam (ZOSYN) IVPB 2.25 g     2.25 g 100 mL/hr over 30 Minutes Intravenous Every 8 hours 04/17/15 0852     04/16/15 1430  ciprofloxacin (CIPRO) IVPB 400 mg    Comments:  Hang ON CALL to IR 7/22   400 mg 200 mL/hr over 60 Minutes Intravenous To Short Stay 04/16/15 1338 04/16/15  1735   04/15/15 0800  piperacillin-tazobactam (ZOSYN) IVPB 2.25 g  Status:  Discontinued     2.25 g 100 mL/hr over 30 Minutes Intravenous Every 8 hours 04/15/15 0036 04/17/15 0844   04/15/15 0045  piperacillin-tazobactam (ZOSYN) IVPB 2.25 g     2.25 g 100 mL/hr over 30 Minutes Intravenous  Once 04/15/15 0036 04/15/15 0130      Assessment: 78 YOF receiving treatment for UTI and cholecystitis. WBC 24.7, afebrile. Zosyn day 4 (started 7/21). Renal function unchanged since initiation  7/21 UCx: contam  7/21 BCx: ngtd  7/21 MRSA screen neg  7/22 Cdiff PCR - negative   Plan:  -Continue Zosyn 2.25 gm IV q8h  -Pharmacy will sign off at this time

## 2015-04-18 NOTE — Progress Notes (Signed)
Patient Demographics:    Heather Harmon, is a 69 y.o. female, DOB - 1946-03-03, HMC:[id number]  Admit date - 04/14/2015   Admitting Physician Rush Farmer, MD  Outpatient Primary MD for the patient is [name],[name], MD  LOS - 4   No chief complaint on file.     Summary  69 year old female with no known medical history reports "yellow eyes" for about one month now. About 3-4 days ago she began to have SOB with even minimal exertion which was progressively getting worse. 7/19 late PM she had several episodes of vomiting, and 7/20 she had weakness with multiple falls. She also had several loose stools that were noted to be bloody with clots by family. For these reasons she presented to the ER 7/20. Given her presentation she was sent for a CT scan of the abdomen and pelvis, the results of which are described above. 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   While in ED she developed sinus tachycardia with rates up to the 160s, which resolved with IV metoprolol. She had an abdominal ultrasound as well, however, those results are unavailable to me at this time. She was transferred to [facility] for further evaluation.   She was admitted by pulmonary critical care, she was also seen by GI, she underwent EGD with CBD mass biopsy along with bili drain placement by IR. She was transferred to hospitalist service under my care on 04/18/2015.    Subjective:    Heather Harmon today has, No headache, No chest pain, No abdominal pain - No Nausea, No new weakness tingling or numbness, No Cough - SOB.     Assessment  & Plan :   1. Severe obstructive jaundice secondary to CBD mass suspicious for  cholangiocarcinoma. GI and I are on board, EGD has been done with biopsy of the suspicious mass along with CBD drain placement by IR. There was also suspicion for cholecystitis. Has been placed on IV Zosyn by GI and pulmonary critical care. We will await biopsy results have requested oncology to evaluate, for now continue anti-biotics, further management per GI and oncology were been consulted.   2. Leukocytosis. Question reactionary due to #1 above. Doubt any active cholecystitis, has CBD drain, C. difficile negative, x-ray and UA unremarkable, blood cultures 2 sets negative. She has been afebrile, We'll continue anti-biotics for 1 more day and likely stop on 04/19/2015. Thereafter monitor clinically.   3. Runs of SVT in ICU. Resolved, check TSH and echocardiogram and monitor.   4. ARF. Renal following. Non-oliguric. Likely will avoid dialysis.   5. Coagulopathy with GI blood loss. Received 7 units of FFP and 1 unit of packed RBC in ICU, monitor INR and H&H.   6. Hypokalemia. Replaced and monitored.     Code Status : Full  Family Communication  : None Present  Disposition Plan  : TBD  Consults  :    IR, GI, PCCM, Oncology  Procedures  :   CT abd/pelvis 7/20 > Significant intrahepatic and proximal extrahepatic biliary dilation and questionably obstructing mass lesion at the CBD concerning for cholangiocarcinoma. Contracted gallbladder containing high attenuation material. Enlarged perihepatic lymph node.  US abdomen 7/20 > Results from [facility] not available at this time.  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 loperamide  2 mg Oral QID  . pantoprazole  40 mg Oral Q0600  . piperacillin-tazobactam (ZOSYN)  IV  2.25 g Intravenous Q8H  . potassium chloride  40 mEq Oral Q4H    . saccharomyces boulardii  250 mg Oral BID   Continuous Infusions: . sodium chloride 50 mL/hr at 04/18/15 0910   PRN Meds:.sodium chloride, metoprolol  Antibiotics  :     Anti-infectives    Start     Dose/Rate Route Frequency Ordered Stop   04/17/15 1500  piperacillin-tazobactam (ZOSYN) IVPB 2.25 g     2.25 g 100 mL/hr over 30 Minutes Intravenous Every 8 hours 04/17/15 0852     04/16/15 1430  ciprofloxacin (CIPRO) IVPB 400 mg    Comments:  Hang ON CALL to IR 7/22   400 mg 200 mL/hr over 60 Minutes Intravenous To Short Stay 04/16/15 1338 04/16/15 1735   04/15/15 0800  piperacillin-tazobactam (ZOSYN) IVPB 2.25 g  Status:  Discontinued     2.25 g 100 mL/hr over 30 Minutes Intravenous Every 8 hours 04/15/15 0036 04/17/15 0844   04/15/15 0045  piperacillin-tazobactam (ZOSYN) IVPB 2.25 g     2.25 g 100 mL/hr over 30 Minutes Intravenous  Once 04/15/15 0036 04/15/15 0130        Objective:   Filed Vitals:   04/17/15 1208 04/17/15 1403 04/17/15 2154 04/18/15 0650  BP:  87/71 113/63 115/72  Pulse:  88 95 88  Temp: 97.9 F (36.6 C) 97.6 F (36.4 C) 98.5 F (36.9 C) 97.9 F (36.6 C)  TempSrc: Oral Oral Oral Oral  Resp:  20 18 18   Height:      Weight:      SpO2:  97% 99% 100%    Wt Readings from Last 3 Encounters:  04/17/15 62 kg (136 lb 11 oz)     Intake/Output Summary (Last 24 hours) at 04/18/15 1132 Last data filed at 04/18/15 0925  Gross per 24 hour  Intake   2558 ml  Output    625 ml  Net   1933 ml     Physical Exam  Awake Alert, Oriented X 3, No new F.N deficits, Normal affect [place].AT,PERRAL, frank jaundice and icterus Supple Neck,No JVD, No cervical lymphadenopathy appriciated.  Symmetrical Chest wall movement, Good air movement bilaterally, CTAB RRR,No Gallops,Rubs or new Murmurs, No Parasternal Heave +ve B.Sounds, Abd Soft, No tenderness, No organomegaly appriciated, No rebound - guarding or rigidity. No Cyanosis, Clubbing or edema, No new Rash or  bruise  Right upper quadrant gallbladder drain, flexiseal, Foley catheter in place    Data Review:   Micro Results Recent Results (from the past 240 hour(s))  MRSA PCR Screening     Status: None   Collection Time: 04/14/15 10:56 PM  Result Value Ref Range Status   MRSA by PCR NEGATIVE NEGATIVE Final    Comment:        The GeneXpert MRSA Assay (FDA approved for NASAL specimens only), is one component of a comprehensive MRSA colonization surveillance program. It is not intended to diagnose MRSA infection nor to guide or monitor treatment for MRSA infections.   Urine culture  Status: None   Collection Time: 04/15/15 12:48 AM  Result Value Ref Range Status   Specimen Description URINE, CLEAN CATCH  Final   Special Requests NONE  Final   Culture MULTIPLE SPECIES PRESENT, SUGGEST RECOLLECTION  Final   Report Status 04/16/2015 FINAL  Final  Culture, blood (routine x 2)     Status: None (Preliminary result)   Collection Time: 04/15/15  1:26 AM  Result Value Ref Range Status   Specimen Description BLOOD RIGHT ANTECUBITAL  Final   Special Requests BOTTLES DRAWN AEROBIC AND ANAEROBIC 10CC EACH  Final   Culture NO GROWTH 2 DAYS  Final   Report Status PENDING  Incomplete  Culture, blood (routine x 2)     Status: None (Preliminary result)   Collection Time: 04/15/15  1:37 AM  Result Value Ref Range Status   Specimen Description BLOOD RIGHT FOREARM  Final   Special Requests BOTTLES DRAWN AEROBIC ONLY [place]  Final   Culture NO GROWTH 2 DAYS  Final   Report Status PENDING  Incomplete  Clostridium Difficile by PCR (not at [facility])     Status: None   Collection Time: 04/16/15 12:14 PM  Result Value Ref Range Status   C difficile by pcr NEGATIVE NEGATIVE Final    Radiology Reports Dg Chest Port 1 View  04/15/2015   CLINICAL DATA:  Acute onset of generalized shortness of breath and weakness. Initial encounter.  EXAM: PORTABLE CHEST - 1 VIEW  COMPARISON:  None.  FINDINGS: The lungs are  well-aerated. Minimal right basilar density is thought to reflect overlying soft tissues. There is no evidence of focal opacification, pleural effusion or pneumothorax.  The cardiomediastinal silhouette is within normal limits. No acute osseous abnormalities are seen.  IMPRESSION: No acute cardiopulmonary process seen.   Electronically Signed   By: Garald Balding M.D.   On: 04/15/2015 01:51   Ir Biliary Drain Placement With Cholangiogram  04/16/2015   INDICATION: High-grade biliary obstruction with bilirubin of 42 and endoscopy demonstrating 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 ERCP directed biliary stent placement was not possible and the patient now presents for percutaneous biliary drainage.  EXAM: ULTRASOUND AND FLUOROSCOPIC GUIDED PERCUTANEOUS TRANSHEPATIC CHOLANGIOGRAM AND BILIARY DRAINAGE CATHETER PLACEMENT  COMPARISON:  None.  MEDICATIONS: 2.0 mg IV Versed sec, 100 mcg IV fentanyl. A scheduled dose of 2.25 g IV Zosyn as well as 400 mg IV Cipro were administered.  CONTRAST:  30 mL OMNIPAQUE IOHEXOL 300 MG/ML  SOLN  ANESTHESIA/SEDATION: Total Moderate Sedation Time  30 minutes  FLUOROSCOPY TIME:  4 minutes and 49 seconds.  COMPLICATIONS: None  TECHNIQUE: Informed written consent was obtained from [facility], Lei A after a discussion of the risks, benefits and alternatives to treatment. Questions regarding the procedure were encouraged and answered. A timeout was performed prior to the initiation of the procedure.  The right upper abdominal quadrant was prepped and draped in the usual sterile fashion, and a sterile drape was applied covering the operative field. Maximum barrier sterile technique with sterile gowns and gloves were used for the procedure.  Ultrasound scanning of the right upper abdominal quadrant was performed to delineate the anatomy and avoid transgression of the gallbladder or pleura. Local anesthesia was provided with 1% lidocaine. Under direct ultrasound guidance, a  21 gauge needle was advanced into a right lobe intrahepatic duct. Contrast injection was performed and cholangiography performed of the intrahepatic biliary tree.  A guidewire was advanced through the needle and an Accustick dilator advanced into the central  bile ducts. The Accustick catheter was exchanged for a Kumpe catheter over a [employer] wire. With the use of a regular glide wire, the Kumpe catheter was advanced beyond the level of the common bile duct obstruction and into the duodenum.  Over an Amplatz stiff wire, the tract was dilated and a 10.2 [place] biliary drainage catheter was advanced with coil ultimately locked within the duodenum. Contrast was injected and a completion radiograph was obtained. The catheter was connected to a drainage bag which yielded the brisk return of clear bile. The catheter was secured to the skin with an interrupted suture and StatLock device. The patient tolerated the procedure well without immediate postprocedural complication.  FINDINGS: Cholangiography demonstrates a high-grade biliary obstruction with massively dilated intrahepatic and central bile ducts present. There was beaking noted at the level of the common bile duct with significant obstruction present over the distal segment of the CBD. Based on appearance, there may be more than just an ampullary process causing biliary stricture which extends higher than expected for ampullary involvement alone.  After crossing the obstruction, the biliary drainage catheter was able to be formed in the duodenum with sideholes extending up into the central biliary tree. The catheter will be left to gravity drainage to allow bilirubin to decrease. Ultimately, after pending tissue biopsy results from endoscopic biopsies, the common bile duct stricture may be able to be treated with an internalized permanent biliary stent from current percutaneous access.  IMPRESSION: Cholangiogram confirms the presence of a high-grade biliary  obstruction with obstruction at the level of the common bile duct. As above, the stricture of the common bile duct appears to be fairly long and there may be fairly extensive tumor causing constriction of the common bile duct. 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 However, a pancreatic neoplasm may also be present and additional characterization with MRI may be helpful. A 10 French internal/external biliary drainage catheter was able to be placed percutaneously across the level of biliary obstruction and into the duodenum. This will be left to gravity drainage. The common bile duct stricture may be amenable to internalized permanent biliary stent placement once the biliary tree has been allowed to decompress and pending endoscopic biopsy results.   Electronically Signed   By: Aletta Edouard M.D.   On: 04/16/2015 18:50     CBC  Recent Labs Lab 04/15/15 0750 04/15/15 1646 04/16/15 0127 04/17/15 0234 04/18/15 0442  WBC 19.3* 16.5* 16.9* 21.1* 24.7*  HGB 6.6* 7.8* 9.0* 8.3* 8.3*  HCT 19.6* 22.7* 25.7* 23.6* 24.4*  PLT 288 274 267 244 188  MCV 84.1 87.0 85.4 86.8 87.8  MCH 28.3 29.9 29.9 30.5 29.9  MCHC 33.7 34.4 35.0 35.2 34.0  RDW 17.1* 16.9* 16.0* 16.4* 16.2*    Chemistries   Recent Labs Lab 04/15/15 0126 04/15/15 0750 04/15/15 1330 04/16/15 0127 04/16/15 1414 04/17/15 0234 04/18/15 0442  NA 130* 135 138 138 140 137 136  K 3.2* 3.2* 2.8* 3.0* 3.4* 2.8* 3.2*  CL 102 109 108 102 106 98* 99*  CO2 11* 12* 11* 19* 19* 25 25  GLUCOSE 99 86 85 103* 93 149* 112*  BUN 100* 94* 82* 81* 78* 68* 51*  CREATININE 7.44* 6.32* 6.24* 6.08* 6.03* 5.52* 4.80*  CALCIUM 7.8* 7.7* 8.1* 8.0* 8.1* 7.9* 8.0*  MG 1.2* 1.6*  --   --   --   --   --   AST 114*  --   --  72*  --  63* 32  ALT 79*  --   --  60*  --  49 35  ALKPHOS 682*  --   --  542*  --  562* 439*  BILITOT 40.8*  --   --  42.2*  --  38.5* 24.8*    ------------------------------------------------------------------------------------------------------------------ estimated creatinine clearance is 10.2 mL/min (by C-G formula based on Cr of 4.8). ------------------------------------------------------------------------------------------------------------------ No results for input(s): HGBA1C in the last 72 hours. ------------------------------------------------------------------------------------------------------------------ No results for input(s): CHOL, HDL, LDLCALC, TRIG, CHOLHDL, LDLDIRECT in the last 72 hours. ------------------------------------------------------------------------------------------------------------------ No results for input(s): TSH, T4TOTAL, T3FREE, THYROIDAB in the last 72 hours.  Invalid input(s): FREET3 ------------------------------------------------------------------------------------------------------------------ No results for input(s): VITAMINB12, FOLATE, FERRITIN, TIBC, IRON, RETICCTPCT in the last 72 hours.  Coagulation profile  Recent Labs Lab 04/15/15 0126 04/15/15 1330 04/16/15 0127 04/17/15 0234  INR 5.78* 1.23 1.08 1.12    No results for input(s): DDIMER in the last 72 hours.  Cardiac Enzymes  Recent Labs Lab 04/15/15 0126 04/15/15 0750 04/15/15 1330  TROPONINI 0.10* 0.05* 0.05*   ------------------------------------------------------------------------------------------------------------------ Invalid input(s): POCBNP   Time Spent in minutes  35   Ekansh Sherk K M.D on 04/18/2015 at 11:32 AM  Between 7am to 7pm - Pager - [phone]  After 7pm go to [email or web address] - password [email or web address]  [facility] -  Office  [phone]

## 2015-04-19 ENCOUNTER — Inpatient Hospital Stay (HOSPITAL_COMMUNITY): Payer: Commercial Managed Care - HMO

## 2015-04-19 DIAGNOSIS — R6520 Severe sepsis without septic shock: Secondary | ICD-10-CM

## 2015-04-19 DIAGNOSIS — A419 Sepsis, unspecified organism: Principal | ICD-10-CM

## 2015-04-19 LAB — TYPE AND SCREEN
ABO/RH(D): B POS
Antibody Screen: NEGATIVE
UNIT DIVISION: 0
UNIT DIVISION: 0

## 2015-04-19 LAB — COMPREHENSIVE METABOLIC PANEL
ALBUMIN: 1.6 g/dL — AB (ref 3.5–5.0)
ALT: 30 U/L (ref 14–54)
ANION GAP: 10 (ref 5–15)
AST: 30 U/L (ref 15–41)
Alkaline Phosphatase: 335 U/L — ABNORMAL HIGH (ref 38–126)
BILIRUBIN TOTAL: 19.8 mg/dL — AB (ref 0.3–1.2)
BUN: 42 mg/dL — AB (ref 6–20)
CO2: 22 mmol/L (ref 22–32)
Calcium: 8.5 mg/dL — ABNORMAL LOW (ref 8.9–10.3)
Chloride: 104 mmol/L (ref 101–111)
Creatinine, Ser: 4.36 mg/dL — ABNORMAL HIGH (ref 0.44–1.00)
GFR calc Af Amer: 11 mL/min — ABNORMAL LOW (ref >60)
GFR calc non Af Amer: 10 mL/min — ABNORMAL LOW (ref >60)
GLUCOSE: 103 mg/dL — AB (ref 65–99)
POTASSIUM: 4.1 mmol/L (ref 3.5–5.1)
Sodium: 136 mmol/L (ref 135–145)
TOTAL PROTEIN: 5.3 g/dL — AB (ref 6.5–8.1)

## 2015-04-19 LAB — CBC
HCT: 22.8 % — ABNORMAL LOW (ref 36.0–46.0)
Hemoglobin: 7.7 g/dL — ABNORMAL LOW (ref 12.0–15.0)
MCH: 30.6 pg (ref 26.0–34.0)
MCHC: 33.8 g/dL (ref 30.0–36.0)
MCV: 90.5 fL (ref 78.0–100.0)
Platelets: 181 10*3/uL (ref 150–400)
RBC: 2.52 MIL/uL — ABNORMAL LOW (ref 3.87–5.11)
RDW: 17.2 % — ABNORMAL HIGH (ref 11.5–15.5)
WBC: 24.1 10*3/uL — ABNORMAL HIGH (ref 4.0–10.5)

## 2015-04-19 LAB — MAGNESIUM: MAGNESIUM: 1.6 mg/dL — AB (ref 1.7–2.4)

## 2015-04-19 LAB — T4, FREE: FREE T4: 0.73 ng/dL (ref 0.61–1.12)

## 2015-04-19 LAB — TSH: TSH: 0.398 u[IU]/mL (ref 0.350–4.500)

## 2015-04-19 MED ORDER — CHOLESTYRAMINE 4 G PO PACK
4.0000 g | PACK | Freq: Two times a day (BID) | ORAL | Status: DC
  Administered 2015-04-19 – 2015-04-22 (×7): 4 g via ORAL
  Filled 2015-04-19 (×8): qty 1

## 2015-04-19 MED ORDER — MAGNESIUM SULFATE 2 GM/50ML IV SOLN
2.0000 g | Freq: Once | INTRAVENOUS | Status: AC
  Administered 2015-04-19: 2 g via INTRAVENOUS
  Filled 2015-04-19: qty 50

## 2015-04-19 MED ORDER — FERROUS SULFATE 325 (65 FE) MG PO TABS
325.0000 mg | ORAL_TABLET | Freq: Every day | ORAL | Status: DC
  Administered 2015-04-20 – 2015-04-22 (×3): 325 mg via ORAL
  Filled 2015-04-19 (×3): qty 1

## 2015-04-19 NOTE — Progress Notes (Signed)
Patient Demographics:    Heather Harmon, is a 69 y.o. female, DOB - 1945-10-16, XTA:[id number]  Admit date - 04/14/2015   Admitting Physician Rush Farmer, MD  Outpatient Primary MD for the patient is [name],[name], MD  LOS - 5   No chief complaint on file.     Summary  69 year old female with no known medical history reports "yellow eyes" for about one month now. About 3-4 days ago she began to have SOB with even minimal exertion which was progressively getting worse. 7/19 late PM she had several episodes of vomiting, and 7/20 she had weakness with multiple falls. She also had several loose stools that were noted to be bloody with clots by family. For these reasons she presented to the ER 7/20. Given her presentation she was sent for a CT scan of the abdomen and pelvis, the results of which are described above. 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   While in ED she developed sinus tachycardia with rates up to the 160s, which resolved with IV metoprolol. She had an abdominal ultrasound as well, however, those results are unavailable to me at this time. She was transferred to [facility] for further evaluation.   She was admitted by pulmonary critical care, she was also seen by GI, she underwent EGD with CBD mass biopsy along with bili drain placement by IR. She was transferred to hospitalist service under my care on 04/18/2015.    Subjective:    Heather Harmon today has, No headache, No chest pain, No abdominal pain - No Nausea, No new weakness tingling or numbness, No Cough - SOB.  Feels fine.   Assessment  & Plan :   1. Severe obstructive jaundice secondary to CBD mass suspicious for  cholangiocarcinoma. GI and I are on board, EGD has been done with biopsy of the suspicious mass along with CBD drain placement by IR. There was also suspicion for cholecystitis. Has been placed on IV Zosyn by GI and pulmonary critical care. We will await biopsy results have requested oncology to evaluate, for now continue anti-biotics, further management per CCS, GI and oncology both have been consulted. Might require Whipple's procedure based on pathology followed by CT chest if needed .   2. Leukocytosis. Question reactionary due to #1 above. Doubt any active cholecystitis, has CBD drain, C. difficile negative, x-ray and UA unremarkable, blood cultures 2 sets negative. She has been afebrile, Zosyn stopped on 04/18/2015. Continue to monitor clinically. Remains afebrile.   3. Runs of SVT in ICU. Resolved, TSH is borderline low will repeat TSH along with free T3 and T4, pending echocardiogram and monitor.   4. ARF. Renal following. Non-oliguric. Likely will avoid dialysis.   5. Coagulopathy with GI blood loss. Received vitamin K, 7 units of FFP and 1 unit of packed RBC in ICU, stable INR and H&H now   6. Hypokalemia and hypomagnesemia. Replaced and monitored.     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 Contracted gallbladder containing high attenuation material. Enlarged perihepatic lymph node.  US abdomen 7/20 > Results from [place] not available at this time.  EGD 7/22 (Dr Olevia Perches) >> duodenal mass in peri-ampulary area, biopsied  Biliary drain in place 7/22   TTE    DVT Prophylaxis  : Heparin   Lab Results  Component Value Date   PLT 181 04/19/2015    Inpatient Medications  Scheduled  Meds: . cholestyramine  4 g Oral BID  . [START ON 04/20/2015] ferrous sulfate  325 mg Oral Q breakfast  . heparin subcutaneous  5,000 Units Subcutaneous 3 times per day  . loperamide  2 mg Oral QID  . magnesium sulfate 1 - 4 g bolus IVPB  2 g Intravenous Once  . pantoprazole  40 mg Oral Q0600  . piperacillin-tazobactam (ZOSYN)  IV  2.25 g Intravenous Q8H  . saccharomyces boulardii  250 mg Oral BID   Continuous Infusions: . sodium chloride 50 mL/hr at 04/18/15 2137   PRN Meds:.sodium chloride, metoprolol  Antibiotics  :     Anti-infectives    Start     Dose/Rate Route Frequency Ordered Stop   04/17/15 1500  piperacillin-tazobactam (ZOSYN) IVPB 2.25 g     2.25 g 100 mL/hr over 30 Minutes Intravenous Every 8 hours 04/17/15 0852     04/16/15 1430  ciprofloxacin (CIPRO) IVPB 400 mg    Comments:  Hang ON CALL to IR 7/22   400 mg 200 mL/hr over 60 Minutes Intravenous To Short Stay 04/16/15 1338 04/16/15 1735   04/15/15 0800  piperacillin-tazobactam (ZOSYN) IVPB 2.25 g  Status:  Discontinued     2.25 g 100 mL/hr over 30 Minutes Intravenous Every 8 hours 04/15/15 0036 04/17/15 0844   04/15/15 0045  piperacillin-tazobactam (ZOSYN) IVPB 2.25 g     2.25 g 100 mL/hr over 30 Minutes Intravenous  Once 04/15/15 0036 04/15/15 0130        Objective:   Filed Vitals:   04/18/15 1300 04/18/15 1407 04/18/15 2046 04/19/15 0538  BP:  153/135 118/72 110/63  Pulse:  88 97 94  Temp:  98.9 F (37.2 C) 98.7 F (37.1 C) 98 F (36.7 C)  TempSrc:  Oral Oral   Resp:  20 20 20   Height:      Weight: 65.3 kg (143 lb 15.4 oz)   64.3 kg (141 lb 12.1 oz)  SpO2:  97% 100% 99%    Wt Readings from Last 3 Encounters:  04/19/15 64.3 kg (141 lb 12.1 oz)     Intake/Output Summary (Last 24 hours) at 04/19/15 1123 Last data filed at 04/19/15 1120  Gross per 24 hour  Intake   1723 ml  Output   2350 ml  Net   -627 ml     Physical Exam  Awake Alert, Oriented X 3, No new F.N deficits, Normal  affect [place].AT,PERRAL, frank jaundice and icterus Supple Neck,No JVD, No cervical lymphadenopathy appriciated.  Symmetrical Chest wall movement, Good air movement bilaterally, CTAB RRR,No Gallops,Rubs or new Murmurs, No Parasternal Heave +ve B.Sounds, Abd Soft, No tenderness, No organomegaly appriciated, No rebound - guarding or rigidity. No Cyanosis, Clubbing or edema, No new Rash or bruise  Right upper quadrant gallbladder drain, flexiseal, Foley catheter in place    Data Review:   Micro Results Recent Results (from the past 240 hour(s))  MRSA PCR Screening     Status:  None   Collection Time: 04/14/15 10:56 PM  Result Value Ref Range Status   MRSA by PCR NEGATIVE NEGATIVE Final    Comment:        The GeneXpert MRSA Assay (FDA approved for NASAL specimens only), is one component of a comprehensive MRSA colonization surveillance program. It is not intended to diagnose MRSA infection nor to guide or monitor treatment for MRSA infections.   Urine culture     Status: None   Collection Time: 04/15/15 12:48 AM  Result Value Ref Range Status   Specimen Description URINE, CLEAN CATCH  Final   Special Requests NONE  Final   Culture MULTIPLE SPECIES PRESENT, SUGGEST RECOLLECTION  Final   Report Status 04/16/2015 FINAL  Final  Culture, blood (routine x 2)     Status: None (Preliminary result)   Collection Time: 04/15/15  1:26 AM  Result Value Ref Range Status   Specimen Description BLOOD RIGHT ANTECUBITAL  Final   Special Requests BOTTLES DRAWN AEROBIC AND ANAEROBIC 10CC EACH  Final   Culture NO GROWTH 3 DAYS  Final   Report Status PENDING  Incomplete  Culture, blood (routine x 2)     Status: None (Preliminary result)   Collection Time: 04/15/15  1:37 AM  Result Value Ref Range Status   Specimen Description BLOOD RIGHT FOREARM  Final   Special Requests BOTTLES DRAWN AEROBIC ONLY [place]  Final   Culture NO GROWTH 3 DAYS  Final   Report Status PENDING  Incomplete  Clostridium  Difficile by PCR (not at [facility])     Status: None   Collection Time: 04/16/15 12:14 PM  Result Value Ref Range Status   C difficile by pcr NEGATIVE NEGATIVE Final    Radiology Reports Dg Chest Port 1 View  04/15/2015   CLINICAL DATA:  Acute onset of generalized shortness of breath and weakness. Initial encounter.  EXAM: PORTABLE CHEST - 1 VIEW  COMPARISON:  None.  FINDINGS: The lungs are well-aerated. Minimal right basilar density is thought to reflect overlying soft tissues. There is no evidence of focal opacification, pleural effusion or pneumothorax.  The cardiomediastinal silhouette is within normal limits. No acute osseous abnormalities are seen.  IMPRESSION: No acute cardiopulmonary process seen.   Electronically Signed   By: Garald Balding M.D.   On: 04/15/2015 01:51   Ir Biliary Drain Placement With Cholangiogram  04/16/2015   INDICATION: High-grade biliary obstruction with bilirubin of 42 and endoscopy demonstrating 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 ERCP directed biliary stent placement was not possible and the patient now presents for percutaneous biliary drainage.  EXAM: ULTRASOUND AND FLUOROSCOPIC GUIDED PERCUTANEOUS TRANSHEPATIC CHOLANGIOGRAM AND BILIARY DRAINAGE CATHETER PLACEMENT  COMPARISON:  None.  MEDICATIONS: 2.0 mg IV Versed sec, 100 mcg IV fentanyl. A scheduled dose of 2.25 g IV Zosyn as well as 400 mg IV Cipro were administered.  CONTRAST:  30 mL OMNIPAQUE IOHEXOL 300 MG/ML  SOLN  ANESTHESIA/SEDATION: Total Moderate Sedation Time  30 minutes  FLUOROSCOPY TIME:  4 minutes and 49 seconds.  COMPLICATIONS: None  TECHNIQUE: Informed written consent was obtained from [facility], Miela A after a discussion of the risks, benefits and alternatives to treatment. Questions regarding the procedure were encouraged and answered. A timeout was performed prior to the initiation of the procedure.  The right upper abdominal quadrant was prepped and draped in the usual sterile  fashion, and a sterile drape was applied covering the operative field. Maximum barrier sterile technique with sterile gowns and gloves were  used for the procedure.  Ultrasound scanning of the right upper abdominal quadrant was performed to delineate the anatomy and avoid transgression of the gallbladder or pleura. Local anesthesia was provided with 1% lidocaine. Under direct ultrasound guidance, a 21 gauge needle was advanced into a right lobe intrahepatic duct. Contrast injection was performed and cholangiography performed of the intrahepatic biliary tree.  A guidewire was advanced through the needle and an Accustick dilator advanced into the central bile ducts. The Accustick catheter was exchanged for a Kumpe catheter over a [employer] wire. With the use of a regular glide wire, the Kumpe catheter was advanced beyond the level of the common bile duct obstruction and into the duodenum.  Over an Amplatz stiff wire, the tract was dilated and a 10.2 [place] biliary drainage catheter was advanced with coil ultimately locked within the duodenum. Contrast was injected and a completion radiograph was obtained. The catheter was connected to a drainage bag which yielded the brisk return of clear bile. The catheter was secured to the skin with an interrupted suture and StatLock device. The patient tolerated the procedure well without immediate postprocedural complication.  FINDINGS: Cholangiography demonstrates a high-grade biliary obstruction with massively dilated intrahepatic and central bile ducts present. There was beaking noted at the level of the common bile duct with significant obstruction present over the distal segment of the CBD. Based on appearance, there may be more than just an ampullary process causing biliary stricture which extends higher than expected for ampullary involvement alone.  After crossing the obstruction, the biliary drainage catheter was able to be formed in the duodenum with sideholes extending up  into the central biliary tree. The catheter will be left to gravity drainage to allow bilirubin to decrease. Ultimately, after pending tissue biopsy results from endoscopic biopsies, the common bile duct stricture may be able to be treated with an internalized permanent biliary stent from current percutaneous access.  IMPRESSION: Cholangiogram confirms the presence of a high-grade biliary obstruction with obstruction at the level of the common bile duct. As above, the stricture of the common bile duct appears to be fairly long and there may be fairly extensive tumor causing constriction of the common bile duct. 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 However, a pancreatic neoplasm may also be present and additional characterization with MRI may be helpful. A 10 French internal/external biliary drainage catheter was able to be placed percutaneously across the level of biliary obstruction and into the duodenum. This will be left to gravity drainage. The common bile duct stricture may be amenable to internalized permanent biliary stent placement once the biliary tree has been allowed to decompress and pending endoscopic biopsy results.   Electronically Signed   By: Aletta Edouard M.D.   On: 04/16/2015 18:50     CBC  Recent Labs Lab 04/15/15 1646 04/16/15 0127 04/17/15 0234 04/18/15 0442 04/19/15 0509  WBC 16.5* 16.9* 21.1* 24.7* 24.1*  HGB 7.8* 9.0* 8.3* 8.3* 7.7*  HCT 22.7* 25.7* 23.6* 24.4* 22.8*  PLT 274 267 244 188 181  MCV 87.0 85.4 86.8 87.8 90.5  MCH 29.9 29.9 30.5 29.9 30.6  MCHC 34.4 35.0 35.2 34.0 33.8  RDW 16.9* 16.0* 16.4* 16.2* 17.2*    Chemistries   Recent Labs Lab 04/15/15 0126 04/15/15 0750  04/16/15 0127 04/16/15 1414 04/17/15 0234 04/18/15 0442 04/19/15 0509  NA 130* 135  < > 138 140 137 136 136  K 3.2* 3.2*  < > 3.0* 3.4* 2.8* 3.2* 4.1  CL 102  109  < > 102 106 98* 99* 104  CO2 11* 12*  < > 19* 19* 25 25 22   GLUCOSE 99 86  < > 103* 93 149* 112* 103*  BUN 100*  94*  < > 81* 78* 68* 51* 42*  CREATININE 7.44* 6.32*  < > 6.08* 6.03* 5.52* 4.80* 4.36*  CALCIUM 7.8* 7.7*  < > 8.0* 8.1* 7.9* 8.0* 8.5*  MG 1.2* 1.6*  --   --   --   --   --  1.6*  AST 114*  --   --  72*  --  63* 32 30  ALT 79*  --   --  60*  --  49 35 30  ALKPHOS 682*  --   --  542*  --  562* 439* 335*  BILITOT 40.8*  --   --  42.2*  --  38.5* 24.8* 19.8*  < > = values in this interval not displayed. ------------------------------------------------------------------------------------------------------------------ estimated creatinine clearance is 11.3 mL/min (by C-G formula based on Cr of 4.36). ------------------------------------------------------------------------------------------------------------------ No results for input(s): HGBA1C in the last 72 hours. ------------------------------------------------------------------------------------------------------------------ No results for input(s): CHOL, HDL, LDLCALC, TRIG, CHOLHDL, LDLDIRECT in the last 72 hours. ------------------------------------------------------------------------------------------------------------------  Recent Labs  04/18/15 1345  TSH 0.294*   ------------------------------------------------------------------------------------------------------------------ No results for input(s): VITAMINB12, FOLATE, FERRITIN, TIBC, IRON, RETICCTPCT in the last 72 hours.  Coagulation profile  Recent Labs Lab 04/15/15 0126 04/15/15 1330 04/16/15 0127 04/17/15 0234  INR 5.78* 1.23 1.08 1.12    No results for input(s): DDIMER in the last 72 hours.  Cardiac Enzymes  Recent Labs Lab 04/15/15 0126 04/15/15 0750 04/15/15 1330  TROPONINI 0.10* 0.05* 0.05*   ------------------------------------------------------------------------------------------------------------------ Invalid input(s): POCBNP   Time Spent in minutes  35   Dyani Babel K M.D on 04/19/2015 at 11:23 AM  Between 7am to 7pm - Pager -  [phone]  After 7pm go to [email or web address] - password [facility]  [facility] -  Office  [phone]

## 2015-04-19 NOTE — Significant Event (Addendum)
Late note:   Multiple bowel movements all day today-all loose/watery, brown & green. RN placed a rectal pouch on to protect patient as stools going to foley catheter whenever she bowel movements. Her sacrum is intact. Family (sisters) made aware and given updates. Rectal pouch placed without issue. C. Diff sent and was negative.

## 2015-04-19 NOTE — Progress Notes (Signed)
Subjective: Patient was seen and examined this morning. She denies any complaints.   Objective: Filed Vitals:   04/18/15 1300 04/18/15 1407 04/18/15 2046 04/19/15 0538  BP:  153/135 118/72 110/63  Pulse:  88 97 94  Temp:  98.9 F (37.2 C) 98.7 F (37.1 C) 98 F (36.7 C)  TempSrc:  Oral Oral   Resp:  20 20 20   Height:      Weight: 143 lb 15.4 oz (65.3 kg)   141 lb 12.1 oz (64.3 kg)  SpO2:  97% 100% 99%   General: Vital signs reviewed.  Patient is an elderly, chronically ill appearing female, in no acute distress and cooperative with exam.  Eyes: + scleral icterus.   Cardiovascular: RRR, S1 normal, S2 normal Pulmonary/Chest: Clear to auscultation bilaterally, no wheezes, rales, or rhonchi. Abdominal: Soft, non-tender, non-distended, BS hyperactive Extremities: No lower extremity edema bilaterally Skin: +jaundice  Intake/Output from previous day: 07/24 0701 - 07/25 0700 In: 1721 [P.O.:356; I.V.:1215; IV Piggyback:150] Out: 2175 [Urine:1650; Drains:525] Intake/Output this shift: Total I/O In: 120 [P.O.:120] Out: -   Lab Results:  Recent Labs  04/18/15 0442 04/19/15 0509  WBC 24.7* 24.1*  HGB 8.3* 7.7*  HCT 24.4* 22.8*  PLT 188 181   BMET:   Recent Labs  04/18/15 0442 04/19/15 0509  NA 136 136  K 3.2* 4.1  CL 99* 104  CO2 25 22  GLUCOSE 112* 103*  BUN 51* 42*  CREATININE 4.80* 4.36*  CALCIUM 8.0* 8.5*   I have reviewed the patient's current medications. Prior to Admission:  No prescriptions prior to admission   Scheduled: . cholestyramine  4 g Oral BID  . [START ON 04/20/2015] ferrous sulfate  325 mg Oral Q breakfast  . heparin subcutaneous  5,000 Units Subcutaneous 3 times per day  . loperamide  2 mg Oral QID  . pantoprazole  40 mg Oral Q0600  . piperacillin-tazobactam (ZOSYN)  IV  2.25 g Intravenous Q8H  . saccharomyces boulardii  250 mg Oral BID   Continuous: . sodium chloride 50 mL/hr at 04/18/15 2137   IWO:EHOZYY chloride,  metoprolol  Assessment/Plan:  Acute Renal Failure: Likely secondary to Acute Tubular Necrosis due to hypoperfusion injury. SCr improving 7.44>6.32>6.08>5.52>4.80>4.36. Unknown baseline. GFR of 11. UOP -4825 yesterday. Patient has improved with IVF. -Continue IVF as needed, currently on NS 50 cc/hr -Nephrology will sign off  Hypokalemia: Resolved. Potassium 4.1 this morning. -Replete as necessary  [place] tumor with Obstructive Jaundice: CA 19.9 > 8000. Patient is s/p percutaneous cholangiogram on 7/22 with internal/external biliary drain and stenting. Surgery plans for MRCP. Considering palliative chemo/radiation. -Further management per primary, GI and Surgery   Nephrology will sign off. Thank you for this consult.    LOS: 5 days   [name], DO PGY-2 Internal Medicine Resident Pager # [phone] 04/19/2015 11:19 AM

## 2015-04-19 NOTE — Progress Notes (Signed)
Daily Rounding Note  04/19/2015, 10:30 AM  LOS: 5 days   SUBJECTIVE:       Appetite improving (80% of this AMs meal).  No pain, no nausea.  Bloody watery stools in flexiseal persist.    Biliary drainage 525 cc yesterday   OBJECTIVE:         Vital signs in last 24 hours:    Temp:  [98 F (36.7 C)-98.9 F (37.2 C)] 98 F (36.7 C) (07/25 0538) Pulse Rate:  [88-97] 94 (07/25 0538) Resp:  [20] 20 (07/25 0538) BP: (110-153)/(63-135) 110/63 mmHg (07/25 0538) SpO2:  [97 %-100 %] 99 % (07/25 0538) Weight:  [141 lb 12.1 oz (64.3 kg)-143 lb 15.4 oz (65.3 kg)] 141 lb 12.1 oz (64.3 kg) (07/25 0538) Last BM Date: 04/18/15 Filed Weights   04/17/15 0600 04/18/15 1300 04/19/15 0538  Weight: 136 lb 11 oz (62 kg) 143 lb 15.4 oz (65.3 kg) 141 lb 12.1 oz (64.3 kg)   General: pleasant, remains icteric    Heart: RRR Chest: clear.  No labored breathing Abdomen: soft, NT, ND.  Active BS.  Brown, non-bloody biliary drainage.  Bloody watery flexiseal drainage  Extremities: no CCE.   Neuro/Psych:  Pleasant, alert, oriented x 3.   Intake/Output from previous day: 07/24 0701 - 07/25 0700 In: 1721 [P.O.:356; I.V.:1215; IV Piggyback:150] Out: 2175 [Urine:1650; Drains:525]  Intake/Output this shift: Total I/O In: 120 [P.O.:120] Out: -   Lab Results:  Recent Labs  04/17/15 0234 04/18/15 0442 04/19/15 0509  WBC 21.1* 24.7* 24.1*  HGB 8.3* 8.3* 7.7*  HCT 23.6* 24.4* 22.8*  PLT 244 188 181   BMET  Recent Labs  04/17/15 0234 04/18/15 0442 04/19/15 0509  NA 137 136 136  K 2.8* 3.2* 4.1  CL 98* 99* 104  CO2 25 25 22   GLUCOSE 149* 112* 103*  BUN 68* 51* 42*  CREATININE 5.52* 4.80* 4.36*  CALCIUM 7.9* 8.0* 8.5*   LFT  Recent Labs  04/17/15 0234 04/18/15 0442 04/19/15 0509  PROT 5.6* 5.6* 5.3*  ALBUMIN 1.8* 1.7* 1.6*  AST 63* 32 30  ALT 49 35 30  ALKPHOS 562* 439* 335*  BILITOT 38.5* 24.8* 19.8*   PT/INR  Recent  Labs  04/17/15 0234  LABPROT 14.6  INR 1.12   Hepatitis Panel No results for input(s): HEPBSAG, HCVAB, HEPAIGM, HEPBIGM in the last 72 hours.  Studies/Results: No results found.  ASSESMENT:   * UGIB EGD 7/22: friable, exophytic, cancerous appearing, peri-ampullary mass was biopsied and is source of bleeding. No hemostatic therapy. On once daily Protonix. continues to have watery, bloody stool.    * Ampulllary, distal CBD mass. S/p perc cholangiogram 7/22. Dr Kathlene Cote placed internal/external biliary drain. Plan to place perc covered stent in several days/a week. LFTs improving daily. CA 19-9 elevated.  Path pending.  Dr Alen Blew, oncologist, saw pt 7/25. Not clear tumor is resectable. Palliative chemo/radiation is also an option but overall prognosis is poor.   * AKI, nonoliguric.Hypoperfusion injury per Dr Jimmy Footman.  Steadily improving. No concept of baseline. Hypokalemia resolved.   * Protein malnutrition. Appetite improved post perc drain.   * ABL anemia, no concept of baseline. S/p PRBC x 1. Hgb continues to drift lower as expected given tumor appearance.   * Coagulopathy corrected after FFP and vit K.   * Hep B surface Ab +, hep B surface Ag negative: immunity. HCV and HIV negative.  * Diarrhea persist despite qid immoium  starting 7/24. ? due to zosyn.  7/22 C diff negative.  WBCs remain at 24K, no fever, urine clx with multiple species, 7/21 cxr negative for PNA/infiltrate, No growth to date on 7/21 blood clxs. ie: No source for leukocytosis.      PLAN   *  Add BID questran for diarrhea.   *  Dr Barry Dienes has ordered MRCP.  NPO for this test.   *  Await endoscopic biopsy pathology report, hopefully back today.   *  Internalization of perc drain/stenting per IR.    *  Added daily oral iron, Hgb in AM.  Would hold transfusion for Hgb </= 7.0.     Azucena Freed  04/19/2015, 10:30 AM Pager: [phone]  Addendum at 1346: Cytopath (brushings)  report: Malignant cells present.  Final surg path report will not finalize until tomorrow.      Attending physician's note   I have taken an interval history, reviewed the chart and examined the patient. I agree with the Advanced Practitioner's note, impression and recommendations. Cytology report reveals malignant cells. Pathology report for biopsy should be ready tomorrow. Adding questran for diarrhea. C diff was negative. Transfuse as needed to keep Hb >7. Oncology following. IR managing biliary obstruction/stents. GI available if needed. GI signing off.   Pricilla Riffle. Fuller Plan, MD [facility]

## 2015-04-19 NOTE — Progress Notes (Signed)
   Subjective: Pt with obstructive jaundice. S/p perc internal/external biliary drain on 7/22 Feeling ok, family at bedside. Denies pain at drain site.  Objective: Physical Exam: BP 110/63 mmHg  Pulse 94  Temp(Src) 98 F (36.7 C) (Oral)  Resp 20  Ht 5\' 5"  (1.651 m)  Wt 141 lb 12.1 oz (64.3 kg)  BMI 23.59 kg/m2  SpO2 99% RUQ drain intact, site clean. Output yellow bilious, 525cc recorded last 24hrs. 175cc so far today, about 50cc in bag now.    Labs: CBC  Recent Labs  04/18/15 0442 04/19/15 0509  WBC 24.7* 24.1*  HGB 8.3* 7.7*  HCT 24.4* 22.8*  PLT 188 181   BMET  Recent Labs  04/18/15 0442 04/19/15 0509  NA 136 136  K 3.2* 4.1  CL 99* 104  CO2 25 22  GLUCOSE 112* 103*  BUN 51* 42*  CREATININE 4.80* 4.36*  CALCIUM 8.0* 8.5*   LFT  Recent Labs  04/19/15 0509  PROT 5.3*  ALBUMIN 1.6*  AST 30  ALT 30  ALKPHOS 335*  BILITOT 19.8*   PT/INR  Recent Labs  04/17/15 0234  LABPROT 14.6  INR 1.12     Studies/Results: No results found.  Assessment/Plan: Biliary obstruction S/p perc I/E drain TBili trending down nicely. MRCP ordered. Will follow along, eventual plans for attempt at stent placement. Briefly discussed with pt and family.    LOS: 5 days    Ascencion Dike PA-C 04/19/2015 1:10 PM   I spent a total of approximately 18 minutes in face to face in clinical consultation, greater than 50% of which was counseling/coordinating care for percutaneous biliary drain placement due to obstruction.

## 2015-04-19 NOTE — Progress Notes (Signed)
Patient ID: Heather Harmon, female   DOB: 03-06-1946, 69 y.o.   MRN: [id number] 3 Days Post-Op  Subjective: Pt feels well today.  Tolerating a solid diet with no abdominal pain  Objective: Vital signs in last 24 hours: Temp:  [98 F (36.7 C)-98.9 F (37.2 C)] 98 F (36.7 C) (07/25 0538) Pulse Rate:  [88-97] 94 (07/25 0538) Resp:  [20] 20 (07/25 0538) BP: (110-153)/(63-135) 110/63 mmHg (07/25 0538) SpO2:  [97 %-100 %] 99 % (07/25 0538) Weight:  [64.3 kg (141 lb 12.1 oz)-65.3 kg (143 lb 15.4 oz)] 64.3 kg (141 lb 12.1 oz) (07/25 0538) Last BM Date: 04/18/15  Intake/Output from previous day: 07/24 0701 - 07/25 0700 In: 1721 [P.O.:356; I.V.:1215; IV Piggyback:150] Out: 2175 [Urine:1650; Drains:525] Intake/Output this shift:    PE: Abd: soft, NT, PTC drain in place with bilious output, ND HEENT: eyes still icteric, but actually less than last week  Lab Results:   Recent Labs  04/18/15 0442 04/19/15 0509  WBC 24.7* 24.1*  HGB 8.3* 7.7*  HCT 24.4* 22.8*  PLT 188 181   BMET  Recent Labs  04/18/15 0442 04/19/15 0509  NA 136 136  K 3.2* 4.1  CL 99* 104  CO2 25 22  GLUCOSE 112* 103*  BUN 51* 42*  CREATININE 4.80* 4.36*  CALCIUM 8.0* 8.5*   PT/INR  Recent Labs  04/17/15 0234  LABPROT 14.6  INR 1.12   CMP     Component Value Date/Time   NA 136 04/19/2015 0509   K 4.1 04/19/2015 0509   CL 104 04/19/2015 0509   CO2 22 04/19/2015 0509   GLUCOSE 103* 04/19/2015 0509   BUN 42* 04/19/2015 0509   CREATININE 4.36* 04/19/2015 0509   CALCIUM 8.5* 04/19/2015 0509   PROT 5.3* 04/19/2015 0509   ALBUMIN 1.6* 04/19/2015 0509   AST 30 04/19/2015 0509   ALT 30 04/19/2015 0509   ALKPHOS 335* 04/19/2015 0509   BILITOT 19.8* 04/19/2015 0509   GFRNONAA 10* 04/19/2015 0509   GFRAA 11* 04/19/2015 0509   Lipase     Component Value Date/Time   LIPASE 18* 04/15/2015 0126       Studies/Results: No results found.  Anti-infectives: Anti-infectives    Start      Dose/Rate Route Frequency Ordered Stop   04/17/15 1500  piperacillin-tazobactam (ZOSYN) IVPB 2.25 g     2.25 g 100 mL/hr over 30 Minutes Intravenous Every 8 hours 04/17/15 0852     04/16/15 1430  ciprofloxacin (CIPRO) IVPB 400 mg    Comments:  Hang ON CALL to IR 7/22   400 mg 200 mL/hr over 60 Minutes Intravenous To Short Stay 04/16/15 1338 04/16/15 1735   04/15/15 0800  piperacillin-tazobactam (ZOSYN) IVPB 2.25 g  Status:  Discontinued     2.25 g 100 mL/hr over 30 Minutes Intravenous Every 8 hours 04/15/15 0036 04/17/15 0844   04/15/15 0045  piperacillin-tazobactam (ZOSYN) IVPB 2.25 g     2.25 g 100 mL/hr over 30 Minutes Intravenous  Once 04/15/15 0036 04/15/15 0130       Assessment/Plan   1. CBD obstruction, likely cholangiocarcinoma -TB trending down nicely after PTC drain -biopsies pending  -I have d/w Dr. Barry Dienes who has reviewed her chart.  She has recommended an MRCP.  If her biopsies come back + for malignancy, which is likely given her CA19-9 is over 8000, then we will order a CT of the chest to rule out mets. -MRCP ordered, NPO for this test, may eat  after. -will follow  LOS: 5 days    Khushbu Pippen E 04/19/2015, 9:20 AM Pager: [phone]

## 2015-04-19 NOTE — Care Management Important Message (Signed)
Important Message  Patient Details  Name: EARLEE HERALD MRN: [id number] Date of Birth: 04/27/1946   Medicare Important Message Given:  Yes-second notification given    Nathen May 04/19/2015, 2:44 [place] Message  Patient Details  Name: HUSNA KRONE MRN: [id number] Date of Birth: 09-Jan-1946   Medicare Important Message Given:  Yes-second notification given    Nathen May 04/19/2015, 2:44 PM

## 2015-04-20 ENCOUNTER — Encounter (HOSPITAL_COMMUNITY): Payer: Self-pay | Admitting: Internal Medicine

## 2015-04-20 ENCOUNTER — Inpatient Hospital Stay (HOSPITAL_COMMUNITY): Payer: Commercial Managed Care - HMO

## 2015-04-20 DIAGNOSIS — C229 Malignant neoplasm of liver, not specified as primary or secondary: Secondary | ICD-10-CM

## 2015-04-20 DIAGNOSIS — R17 Unspecified jaundice: Secondary | ICD-10-CM

## 2015-04-20 DIAGNOSIS — C221 Intrahepatic bile duct carcinoma: Secondary | ICD-10-CM

## 2015-04-20 DIAGNOSIS — Z515 Encounter for palliative care: Secondary | ICD-10-CM

## 2015-04-20 DIAGNOSIS — D689 Coagulation defect, unspecified: Secondary | ICD-10-CM

## 2015-04-20 DIAGNOSIS — R109 Unspecified abdominal pain: Secondary | ICD-10-CM

## 2015-04-20 DIAGNOSIS — I509 Heart failure, unspecified: Secondary | ICD-10-CM

## 2015-04-20 LAB — CBC
HCT: 21.2 % — ABNORMAL LOW (ref 36.0–46.0)
Hemoglobin: 7.1 g/dL — ABNORMAL LOW (ref 12.0–15.0)
MCH: 30.7 pg (ref 26.0–34.0)
MCHC: 33.5 g/dL (ref 30.0–36.0)
MCV: 91.8 fL (ref 78.0–100.0)
Platelets: 180 10*3/uL (ref 150–400)
RBC: 2.31 MIL/uL — AB (ref 3.87–5.11)
RDW: 17.7 % — AB (ref 11.5–15.5)
WBC: 25.1 10*3/uL — AB (ref 4.0–10.5)

## 2015-04-20 LAB — CULTURE, BLOOD (ROUTINE X 2)
CULTURE: NO GROWTH
Culture: NO GROWTH

## 2015-04-20 LAB — COMPREHENSIVE METABOLIC PANEL
ALK PHOS: 279 U/L — AB (ref 38–126)
ALT: 33 U/L (ref 14–54)
ANION GAP: 12 (ref 5–15)
AST: 39 U/L (ref 15–41)
Albumin: 1.6 g/dL — ABNORMAL LOW (ref 3.5–5.0)
BILIRUBIN TOTAL: 19.1 mg/dL — AB (ref 0.3–1.2)
BUN: 34 mg/dL — ABNORMAL HIGH (ref 6–20)
CALCIUM: 8.6 mg/dL — AB (ref 8.9–10.3)
CO2: 21 mmol/L — ABNORMAL LOW (ref 22–32)
Chloride: 101 mmol/L (ref 101–111)
Creatinine, Ser: 4.13 mg/dL — ABNORMAL HIGH (ref 0.44–1.00)
GFR calc non Af Amer: 10 mL/min — ABNORMAL LOW (ref >60)
GFR, EST AFRICAN AMERICAN: 12 mL/min — AB (ref >60)
Glucose, Bld: 101 mg/dL — ABNORMAL HIGH (ref 65–99)
POTASSIUM: 4 mmol/L (ref 3.5–5.1)
SODIUM: 134 mmol/L — AB (ref 135–145)
Total Protein: 5.2 g/dL — ABNORMAL LOW (ref 6.5–8.1)

## 2015-04-20 LAB — MAGNESIUM: MAGNESIUM: 2.1 mg/dL (ref 1.7–2.4)

## 2015-04-20 LAB — T3, FREE: T3 FREE: 0.4 pg/mL — AB (ref 2.0–4.4)

## 2015-04-20 NOTE — Progress Notes (Signed)
IP PROGRESS NOTE  Subjective:   Heather Harmon is doing well this afternoon. She appears comfortable without any complaints. She has not reported any nausea or vomiting. She does not report any abdominal pain or discomfort. She is able to eat small amounts.  Objective:  Vital signs in last 24 hours: Temp:  [97.9 F (36.6 C)-98.8 F (37.1 C)] 98.3 F (36.8 C) (07/26 1422) Pulse Rate:  [86-109] 99 (07/26 1422) Resp:  [16-18] 16 (07/26 1422) BP: (105-131)/(64-71) 128/66 mmHg (07/26 1422) SpO2:  [97 %-100 %] 97 % (07/26 1422) Weight:  [141 lb 12.1 oz (64.3 kg)] 141 lb 12.1 oz (64.3 kg) (07/26 0511) Weight change: -2 lb 3.3 oz (-1 kg) Last BM Date: 04/18/15  Intake/Output from previous day: 07/25 0701 - 07/26 0700 In: 1295.8 [P.O.:960; I.V.:135.8; IV Piggyback:200] Out: 2300 [Urine:1350; Drains:800; Stool:150] Chronically ill-appearing woman appeared jaundiced. Mouth: mucous membranes moist, pharynx normal without lesions Resp: clear to auscultation bilaterally Cardio: regular rate and rhythm, S1, S2 normal, no murmur, click, rub or gallop GI: soft, non-tender; bowel sounds normal; no masses,  no organomegaly Extremities: extremities normal, atraumatic, no cyanosis or edema   Lab Results:  Recent Labs  04/19/15 0509 04/20/15 0517  WBC 24.1* 25.1*  HGB 7.7* 7.1*  HCT 22.8* 21.2*  PLT 181 180    BMET  Recent Labs  04/19/15 0509 04/20/15 0517  NA 136 134*  K 4.1 4.0  CL 104 101  CO2 22 21*  GLUCOSE 103* 101*  BUN 42* 34*  CREATININE 4.36* 4.13*  CALCIUM 8.5* 8.6*  Results for Heather, Harmon (MRN [id number]) as of 04/20/2015 15:20  Ref. Range 04/17/2015 02:34 04/18/2015 04:42 04/19/2015 05:09 04/20/2015 05:17  Total Bilirubin Latest Ref Range: 0.3-1.2 mg/dL 38.5 (HH) 24.8 (HH) 19.8 (HH) 19.1 (HH)    Studies/Results: Mr Abdomen Mrcp Wo Cm  04/20/2015   CLINICAL DATA:  69 year old female with history of jaundice. 3-4 day history of shortness of breath with minimal exertion,  progressively worsening. Multiple recent falls. Blood in the stool.  EXAM: MRI ABDOMEN WITHOUT CONTRAST  (INCLUDING MRCP)  TECHNIQUE: Multiplanar multisequence MR imaging of the abdomen was performed. Heavily T2-weighted images of the biliary and pancreatic ducts were obtained, and three-dimensional MRCP images were rendered by post processing.  COMPARISON:  None.  FINDINGS: Comment: Today's study is limited for assessment of visceral and/or vascular lesions by lack of IV gadolinium. In addition, today's study is limited by considerable patient respiratory motion and respiratory variation.  Lower chest: Signal intensity in the dependent portion of the right lower lobe, concerning for area of aspiration pneumonitis or pneumonia.  Hepatobiliary: Widespread heterogeneous increased T2 signal intensity throughout the hepatic parenchyma, concerning for multifocal metastases. MRCP images are exceedingly limited by patient motion, however, no significant intrahepatic biliary ductal dilatation is appreciated at this time. A percutaneous biliary drainage catheter is noted, but poorly delineated on today's MRI examination. Large filling defect in the lumen of the gallbladder presumably reflects 1 or more large gallstones. In the region of the hepatic hilum there is a very poorly defined mass-like area which is best demonstrated on diffusion images where this lesion is high signal intensity (subsequently low signal intensity on the accompanying ADC map), concerning for large neoplasm. This is estimated to measure approximately 5.1 x 3.9 cm in size (image 22 of series 14). The inferior aspect of this mass is intimately associated with the superior aspect of the pancreatic head, and the adjacent first/second portion of the duodenum. Common bile  duct is poorly demonstrated, but does not appear dilated at this time. Diffuse increased T2 signal intensity adjacent to the portal triads, indicative of periportal edema.  Pancreas: As  discussed above, the large mass-like area centered in the porta hepatis encroaches upon the superior aspect of the pancreatic head. Whether not this mass arises from the pancreatic head is uncertain on today's examination. Body and tail of the pancreas are normal in appearance on today's noncontrast MRI examination. Specifically, no ductal dilatation.  Spleen: Unremarkable.  Adrenals/Urinary Tract: Bilateral adrenal glands and bilateral kidneys are normal in appearance.  Stomach/Bowel: As mentioned above, the large mass-like area centered in the porta hepatis is intimately associated with the first/second portion of the duodenum. Remaining portions of visualized small bowel and colon are unremarkable.  Vascular/Lymphatic: No definite aneurysm identified in the visualized abdominal vasculature. Flow void is noted within the superior mesenteric vein, splenic vein, portal vein and main portal vein branches bilaterally. Prominent soft tissue in the porta hepatis may include some nodal tissue. No other lymphadenopathy is noted elsewhere in the abdomen on today's noncontrast MRI examination.  Other: Trace volume of ascites.  Musculoskeletal: No aggressive osseous lesion identified in the visualized portions of the skeleton on today's noncontrast MRI examination.  IMPRESSION: 1. Very limited study secondary to respiratory motion and lack of IV contrast. With these limitations in mind, there is a large mass centered in the region of the porta hepatis, which likely accounts for the recently discovered biliary tract obstruction. No residual intrahepatic biliary ductal dilatation following placement of percutaneous drainage catheter. There are innumerable hepatic lesions, suggesting widespread metastatic disease to the liver. The mass in the porta hepatis is intimately associated with the superior aspect of the pancreatic head, as well as the first/second portion of the duodenum, which could indicate either primary hepatic,  biliary, pancreatic or duodenal lesion (favored to be primary hepatobiliary). 2. Cholelithiasis. 3. Small volume of ascites.   Electronically Signed   By: Vinnie Langton M.D.   On: 04/20/2015 08:00    Medications: I have reviewed the patient's current medications.  Assessment/Plan:   69 year old woman with the following issues:  1. 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 She is status post EGD and a biopsy with the results confirmed the presence of poorly differentiated carcinoma. MRI of the abdomen indicate there is a large mass in the region of the porta hepatis also indicating innumerable hepatic metastasis suggesting wide spread of her disease.  Given these findings, curative options are also table at this point. The only thing we can offer as palliation of symptoms as they arise. She could be a candidate for single agent gemcitabine chemotherapy if she continues signs of recovery. Alternatively supportive care only and hospice would be also a reasonable option.  She is interested in pursuing palliative chemotherapy if she can tolerated in the future.  I have recommended continued decompression of her biliary tree and continue improvement in her kidney function. If her performance status, nutritional status improves then we can consider palliative chemotherapy as a later date. If these parameters do not improve, then hospice as her only option.  She understands our no curative options and her prognosis is poor.  2. Obstructive jaundice: She status post percutaneous drainage which seems to be improving her bilirubin.  3. Coagulopathy: Likely related to vitamin K deficiency with coagulation parameters improving.  4. Acute renal failure: Her kidney function seems to be improving with good urine output.  We will arrange follow-up for her upon her discharge. Please call with questions in the meantime.    LOS: 6 days   SHADAD,FIRAS  04/20/2015, 3:21 PM

## 2015-04-20 NOTE — Consult Note (Signed)
ASked to see Heather Harmon by Dr. Candiss Norse for palliative consult for goals of care/new diagnosis cholangiocarcinoma  Impression: 1. 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 Protein calories malnutrition  Recommendations: 1. Patient considering options of hospice vs active treatment 2. Patient considering Code Status HPI  Heather Harmon is a 69 year old female with no known medical history reports "yellow eyes" for about one month now. About 3-4 days ago she began to have SOB with even minimal exertion which was progressively getting worse. 7/19 late PM she had several episodes of vomiting, and 7/20 she had weakness with multiple falls. She also had several loose stools that were noted to be bloody with clots by family. For these reasons she presented to the [facility] ER 7/20. Given her presentation she was sent for a CT scan of the abdomen and pelvis. 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 While in ED she developed sinus tachycardia with rates up to the 160s, which resolved with IV metoprolol. She was also septic, most likely cholecystitis, and hypotensive, had acute renal injury with Cr 7 and an INR 5. In addition she has had a GI bleed requiring transfusion.  She was transferred to [facility] to [facility] due to multi-system disease.   Since admission she has had EGD  - A half circumferential fungating and ulcerated mass with friable surfaces was found in the peri-ampullary area and 2nd part duodenum.endoscope traversed into second portion of duodenum without difficulty Multiple biopsies and brushings were performed using cold forceps. Sample sent for histology. ampulla was not visualized due to the mass obscuring the mucosa. It was not clear whether the mass was pancreatic, primary duodenal or ampullary Retroflexed views revealed no abnormalities. The scope was then  withdrawn from the patient and the procedure completed.  Path report: poorly differentiated carcinoma. CA 19-9 8,868.   MRCP 04/19/15 - IMPRESSION: 1. Very limited study secondary to respiratory motion and lack of IV contrast. With these limitations in mind, there is a large mass centered in the region of the porta hepatis, which likely accounts for the recently discovered biliary tract obstruction. No residual intrahepatic biliary ductal dilatation following placement of percutaneous drainage catheter. There are innumerable hepatic lesions, suggesting widespread metastatic disease to the liver. The mass in the porta hepatis is intimately associated with the superior aspect of the pancreatic head, as well as the first/second portion of the duodenum, which could indicate either primary hepatic, biliary, pancreatic or duodenal lesion (favored to be primary hepatobiliary). 2. Cholelithiasis. 3. Small volume of ascites.  She has had 1 uPRBC, Vit K , FFP  Surgery and nephrology have been following. After EGD patient did come to [place] biliary drainage with cholangiography: Findings: Percutaneous access of right intrahepatic bile duct under ultrasound. Cholangiogram shows high grade biliary obstruction at level of distal CBD/ampulla. Obstruction crossed with wire, allowing placement of 10 Fr internal/external biliary drainage catheter. Catheter advanced to duodenum and drainage extends up into central intrahepatic ducts. Catheter attached to gravity bag.  Recommendations: Follow bilirubin and bile output. Once biliary tree decompressed for several days/week, the distal obstruction should be amenable to percutaneous covered stent placement.   Dr. Alen Blew was consulted: poor prognosis, even if she is able to have Whipple procedure, which is not an option given wide-spread metastatic disease and radiation/chemo vs hospice care.  Scheduled Meds:  cholestyramine  4 g Oral BID   ferrous  sulfate  325 mg Oral Q breakfast   heparin subcutaneous  5,000 Units Subcutaneous 3 times per day   loperamide  2 mg Oral QID   pantoprazole  40 mg Oral Q0600   piperacillin-tazobactam (ZOSYN)  IV  2.25 g Intravenous Q8H   saccharomyces boulardii  250 mg Oral BID   Continuous Infusions:  sodium chloride 50 mL/hr at 04/20/15 0417   PRN Meds:.sodium chloride, metoprolol  History reviewed. No pertinent past medical history.  History reviewed. No pertinent past surgical history.  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 Has a sister and nieces. 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  Palliative Care Status-full code but contemplating code status. Recommended DNR/DNI but she needs time to think. Considering options of treatment with some life prolongation vs home with hospice.   Palliative Prophylaxis- no active needs  Palliative Review of Systems: No pain, no anxiety, no complaints.   PE:  Filed Vitals:   04/20/15 0511  BP: 105/64  Pulse: 86  Temp: 97.9 F (36.6 C)  Resp: 18   General: emaciated black woman  HEENT: scleral icterus noted.  Cor: RRR Pul: normal respirations Neuro - awake and alert   CBC Latest Ref Rng 04/20/2015 04/19/2015 04/18/2015  WBC 4.0 - 10.5 K/uL 25.1(H) 24.1(H) 24.7(H)  Hemoglobin 12.0 - 15.0 g/dL 7.1(L) 7.7(L) 8.3(L)  Hematocrit 36.0 - 46.0 % 21.2(L) 22.8(L) 24.4(L)  Platelets 150 - 400 K/uL 180 181 188    CMP Latest Ref Rng 04/20/2015 04/19/2015 04/18/2015  Glucose 65 -  99 mg/dL 101(H) 103(H) 112(H)  BUN 6 - 20 mg/dL 34(H) 42(H) 51(H)  Creatinine 0.44 - 1.00 mg/dL 4.13(H) 4.36(H) 4.80(H)  Sodium 135 - 145 mmol/L 134(L) 136 136  Potassium 3.5 - 5.1 mmol/L 4.0 4.1 3.2(L)  Chloride 101 - 111 mmol/L 101 104 99(L)  CO2 22 - 32 mmol/L 21(L) 22 25  Calcium 8.9 - 10.3 mg/dL 8.6(L) 8.5(L) 8.0(L)  Total Protein 6.5 - 8.1 g/dL 5.2(L) 5.3(L) 5.6(L)  Total Bilirubin 0.3 - 1.2 mg/dL 19.1(HH) 19.8(HH) 24.8(HH)  Alkaline Phos 38 - 126 U/L 279(H) 335(H) 439(H)  AST 15 - 41 U/L 39 30 32  ALT 14 - 54 U/L 33 30 35   Ca 19-9  >8,000  Imaging See HPI  Long conversation with patient and her family: reviewed her test results and diagnosis. Discussed options of active treatment - palliative chemotherapy- vs hospice care. Discussed code status and a recommendation for DNR/DNI. Answered patient's and family's questions.  Will need PMT follow -up  Thank you for this consult.  Time in  1600 Time out 1746  Greater than 50% of time spent on education and counseling.    Adella Hare, MD, [place] Team [phone]

## 2015-04-20 NOTE — Progress Notes (Signed)
Utilization Review completed. Terrel Nesheiwat RN BSN CM 

## 2015-04-20 NOTE — Progress Notes (Signed)
  Echocardiogram 2D Echocardiogram has been performed.  Darlina Sicilian M 04/20/2015, 2:26 PM

## 2015-04-20 NOTE — Progress Notes (Signed)
Patient Demographics:    Heather Harmon, is a 69 y.o. female, DOB - [birth year], XIH:[id number]  Admit date - 04/14/2015   Admitting Physician Rush Farmer, MD  Outpatient Primary MD for the patient is Harmon,[name], MD  LOS - 6   No chief complaint on file.     Summary  69 year old female with no known medical history reports "yellow eyes" for about one month now. About 3-4 days ago she began to have SOB with even minimal exertion which was progressively getting worse. 7/19 late PM she had several episodes of vomiting, and 7/20 she had weakness with multiple falls. She also had several loose stools that were noted to be bloody with clots by family. For these reasons she presented to the ER 7/20. Given her presentation she was sent for a CT scan of the abdomen and pelvis, the results of which are described above. 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   While in ED she developed sinus tachycardia with rates up to the 160s, which resolved with IV metoprolol. She had an abdominal ultrasound as well, however, those results are unavailable to me at this time. She was transferred to [facility] for further evaluation.   She was admitted by pulmonary critical care, she was also seen by GI, she underwent EGD with CBD mass biopsy along with bili drain placement by IR. She was transferred to hospitalist service under my care on 04/18/2015.    Subjective:    Susa Bones today has, No headache, No chest pain, No abdominal pain - No Nausea, No new weakness tingling or numbness, No Cough - SOB.  Feels fine.   Assessment  & Plan :   1. 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 GI and IR on board, EGD has been done with biopsy of the suspicious mass along with CBD drain placement by IR. IR eventually wants to place a drain in the CBD. There was also suspicion for cholecystitis. Has finished her trial of IV Zosyn by GI and pulmonary critical care. 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 Per general surgery she is not a surgical candidate for any surgical procedure. Oncology wants to follow outpatient and they have seen the patient here.  Overall prognosis appears very poor, will involve palliative care, will GI decide on any further procedures for diagnostic workup.     2. Leukocytosis. Question reactionary due to #1 above. Doubt any active cholecystitis, has CBD drain, C. difficile negative, x-ray and UA unremarkable, blood cultures 2 sets negative. She has been afebrile, Zosyn stopped on 04/18/2015. Continue to monitor clinically. Remains afebrile.   3. Runs of SVT in ICU. Resolved, TSH is borderline low will repeat TSH stable,  monitor.   4. ARF. Renal following. Non-oliguric. Likely will avoid dialysis. Gradually improving renal function. Will   5. Coagulopathy with GI blood loss. Received vitamin K, 7 units of FFP and 1 unit of packed RBC in ICU, stable INR and H&H now   6. Hypokalemia and hypomagnesemia. Replaced and monitored.     Code  Status : Full  Family Communication  : None Present  Disposition Plan  : TBD  Consults  :    IR, GI, CCS, PCCM, Oncology  Procedures  :   CT abd/pelvis 7/20 > Significant intrahepatic and proximal extrahepatic biliary dilation and questionably obstructing mass lesion at the CBD concerning for cholangiocarcinoma. Contracted gallbladder containing high attenuation material. Enlarged perihepatic lymph node.  US abdomen 7/20 > Results from [facility] not available at this time.  EGD  7/22 (Dr Olevia Perches) >> duodenal mass in peri-ampulary area, biopsied  Biliary drain in place 7/22        DVT Prophylaxis  : Heparin   Lab Results  Component Value Date   PLT 180 04/20/2015    Inpatient Medications  Scheduled Meds: . cholestyramine  4 g Oral BID  . ferrous sulfate  325 mg Oral Q breakfast  . heparin subcutaneous  5,000 Units Subcutaneous 3 times per day  . loperamide  2 mg Oral QID  . pantoprazole  40 mg Oral Q0600  . piperacillin-tazobactam (ZOSYN)  IV  2.25 g Intravenous Q8H  . saccharomyces boulardii  250 mg Oral BID   Continuous Infusions: . sodium chloride 50 mL/hr at 04/20/15 0417   PRN Meds:.sodium chloride, metoprolol  Antibiotics  :     Anti-infectives    Start     Dose/Rate Route Frequency Ordered Stop   04/17/15 1500  piperacillin-tazobactam (ZOSYN) IVPB 2.25 g     2.25 g 100 mL/hr over 30 Minutes Intravenous Every 8 hours 04/17/15 0852     04/16/15 1430  ciprofloxacin (CIPRO) IVPB 400 mg    Comments:  Hang ON CALL to IR 7/22   400 mg 200 mL/hr over 60 Minutes Intravenous To Short Stay 04/16/15 1338 04/16/15 1735   04/15/15 0800  piperacillin-tazobactam (ZOSYN) IVPB 2.25 g  Status:  Discontinued     2.25 g 100 mL/hr over 30 Minutes Intravenous Every 8 hours 04/15/15 0036 04/17/15 0844   04/15/15 0045  piperacillin-tazobactam (ZOSYN) IVPB 2.25 g     2.25 g 100 mL/hr over 30 Minutes Intravenous  Once 04/15/15 0036 04/15/15 0130        Objective:   Filed Vitals:   04/19/15 0538 04/19/15 1333 04/19/15 2142 04/20/15 0511  BP: 110/63 119/61 131/71 105/64  Pulse: 94 88 109 86  Temp: 98 F (36.7 C) 99.5 F (37.5 C) 98.8 F (37.1 C) 97.9 F (36.6 C)  TempSrc:  Oral Oral Oral  Resp: 20 24 18 18   Height:      Weight: 64.3 kg (141 lb 12.1 oz)   64.3 kg (141 lb 12.1 oz)  SpO2: 99%  98% 100%    Wt Readings from Last 3 Encounters:  04/20/15 64.3 kg (141 lb 12.1 oz)     Intake/Output Summary (Last 24 hours) at 04/20/15 1134 Last data  filed at 04/20/15 1025  Gross per 24 hour  Intake 1415.83 ml  Output   1900 ml  Net -484.17 ml     Physical Exam  Awake Alert, Oriented X 3, No new F.N deficits, Normal affect [place].AT,PERRAL, frank jaundice and icterus Supple Neck,No JVD, No cervical lymphadenopathy appriciated.  Symmetrical Chest wall movement, Good air movement bilaterally, CTAB RRR,No Gallops,Rubs or new Murmurs, No Parasternal Heave +ve B.Sounds, Abd Soft, No tenderness, No organomegaly appriciated, No rebound - guarding or rigidity. No Cyanosis, Clubbing or edema, No new Rash or bruise  Right upper quadrant gallbladder drain    Data Review:   Micro Results Recent  Results (from the past 240 hour(s))  MRSA PCR Screening     Status: None   Collection Time: 04/14/15 10:56 PM  Result Value Ref Range Status   MRSA by PCR NEGATIVE NEGATIVE Final    Comment:        The GeneXpert MRSA Assay (FDA approved for NASAL specimens only), is one component of a comprehensive MRSA colonization surveillance program. It is not intended to diagnose MRSA infection nor to guide or monitor treatment for MRSA infections.   Urine culture     Status: None   Collection Time: 04/15/15 12:48 AM  Result Value Ref Range Status   Specimen Description URINE, CLEAN CATCH  Final   Special Requests NONE  Final   Culture MULTIPLE SPECIES PRESENT, SUGGEST RECOLLECTION  Final   Report Status 04/16/2015 FINAL  Final  Culture, blood (routine x 2)     Status: None (Preliminary result)   Collection Time: 04/15/15  1:26 AM  Result Value Ref Range Status   Specimen Description BLOOD RIGHT ANTECUBITAL  Final   Special Requests BOTTLES DRAWN AEROBIC AND ANAEROBIC 10CC EACH  Final   Culture NO GROWTH 4 DAYS  Final   Report Status PENDING  Incomplete  Culture, blood (routine x 2)     Status: None (Preliminary result)   Collection Time: 04/15/15  1:37 AM  Result Value Ref Range Status   Specimen Description BLOOD RIGHT FOREARM  Final    Special Requests BOTTLES DRAWN AEROBIC ONLY [place]  Final   Culture NO GROWTH 4 DAYS  Final   Report Status PENDING  Incomplete  Clostridium Difficile by PCR (not at [facility])     Status: None   Collection Time: 04/16/15 12:14 PM  Result Value Ref Range Status   C difficile by pcr NEGATIVE NEGATIVE Final    Radiology Reports Mr Abdomen Mrcp Wo Cm  04/20/2015   CLINICAL DATA:  69 year old female with history of jaundice. 3-4 day history of shortness of breath with minimal exertion, progressively worsening. Multiple recent falls. Blood in the stool.  EXAM: MRI ABDOMEN WITHOUT CONTRAST  (INCLUDING MRCP)  TECHNIQUE: Multiplanar multisequence MR imaging of the abdomen was performed. Heavily T2-weighted images of the biliary and pancreatic ducts were obtained, and three-dimensional MRCP images were rendered by post processing.  COMPARISON:  None.  FINDINGS: Comment: Today's study is limited for assessment of visceral and/or vascular lesions by lack of IV gadolinium. In addition, today's study is limited by considerable patient respiratory motion and respiratory variation.  Lower chest: Signal intensity in the dependent portion of the right lower lobe, concerning for area of aspiration pneumonitis or pneumonia.  Hepatobiliary: Widespread heterogeneous increased T2 signal intensity throughout the hepatic parenchyma, concerning for multifocal metastases. MRCP images are exceedingly limited by patient motion, however, no significant intrahepatic biliary ductal dilatation is appreciated at this time. A percutaneous biliary drainage catheter is noted, but poorly delineated on today's MRI examination. Large filling defect in the lumen of the gallbladder presumably reflects 1 or more large gallstones. In the region of the hepatic hilum there is a very poorly defined mass-like area which is best demonstrated on diffusion images where this lesion is high signal intensity (subsequently low signal intensity on the accompanying  ADC map), concerning for large neoplasm. This is estimated to measure approximately 5.1 x 3.9 cm in size (image 22 of series 14). The inferior aspect of this mass is intimately associated with the superior aspect of the pancreatic head, and the adjacent first/second portion of the  duodenum. Common bile duct is poorly demonstrated, but does not appear dilated at this time. Diffuse increased T2 signal intensity adjacent to the portal triads, indicative of periportal edema.  Pancreas: As discussed above, the large mass-like area centered in the porta hepatis encroaches upon the superior aspect of the pancreatic head. Whether not this mass arises from the pancreatic head is uncertain on today's examination. Body and tail of the pancreas are normal in appearance on today's noncontrast MRI examination. Specifically, no ductal dilatation.  Spleen: Unremarkable.  Adrenals/Urinary Tract: Bilateral adrenal glands and bilateral kidneys are normal in appearance.  Stomach/Bowel: As mentioned above, the large mass-like area centered in the porta hepatis is intimately associated with the first/second portion of the duodenum. Remaining portions of visualized small bowel and colon are unremarkable.  Vascular/Lymphatic: No definite aneurysm identified in the visualized abdominal vasculature. Flow void is noted within the superior mesenteric vein, splenic vein, portal vein and main portal vein branches bilaterally. Prominent soft tissue in the porta hepatis may include some nodal tissue. No other lymphadenopathy is noted elsewhere in the abdomen on today's noncontrast MRI examination.  Other: Trace volume of ascites.  Musculoskeletal: No aggressive osseous lesion identified in the visualized portions of the skeleton on today's noncontrast MRI examination.  IMPRESSION: 1. Very limited study secondary to respiratory motion and lack of IV contrast. With these limitations in mind, there is a large mass centered in the region of the porta  hepatis, which likely accounts for the recently discovered biliary tract obstruction. No residual intrahepatic biliary ductal dilatation following placement of percutaneous drainage catheter. There are innumerable hepatic lesions, suggesting widespread metastatic disease to the liver. The mass in the porta hepatis is intimately associated with the superior aspect of the pancreatic head, as well as the first/second portion of the duodenum, which could indicate either primary hepatic, biliary, pancreatic or duodenal lesion (favored to be primary hepatobiliary). 2. Cholelithiasis. 3. Small volume of ascites.   Electronically Signed   By: Vinnie Langton M.D.   On: 04/20/2015 08:00   Dg Chest Port 1 View  04/15/2015   CLINICAL DATA:  Acute onset of generalized shortness of breath and weakness. Initial encounter.  EXAM: PORTABLE CHEST - 1 VIEW  COMPARISON:  None.  FINDINGS: The lungs are well-aerated. Minimal right basilar density is thought to reflect overlying soft tissues. There is no evidence of focal opacification, pleural effusion or pneumothorax.  The cardiomediastinal silhouette is within normal limits. No acute osseous abnormalities are seen.  IMPRESSION: No acute cardiopulmonary process seen.   Electronically Signed   By: Garald Balding M.D.   On: 04/15/2015 01:51   Ir Biliary Drain Placement With Cholangiogram  04/16/2015   INDICATION: High-grade biliary obstruction with bilirubin of 42 and endoscopy demonstrating 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 ERCP directed biliary stent placement was not possible and the patient now presents for percutaneous biliary drainage.  EXAM: ULTRASOUND AND FLUOROSCOPIC GUIDED PERCUTANEOUS TRANSHEPATIC CHOLANGIOGRAM AND BILIARY DRAINAGE CATHETER PLACEMENT  COMPARISON:  None.  MEDICATIONS: 2.0 mg IV Versed sec, 100 mcg IV fentanyl. A scheduled dose of 2.25 g IV Zosyn as well as 400 mg IV Cipro were administered.  CONTRAST:  30 mL OMNIPAQUE IOHEXOL  300 MG/ML  SOLN  ANESTHESIA/SEDATION: Total Moderate Sedation Time  30 minutes  FLUOROSCOPY TIME:  4 minutes and 49 seconds.  COMPLICATIONS: None  TECHNIQUE: Informed written consent was obtained from [facility], Syd A after a discussion of the risks, benefits and alternatives to treatment. Questions regarding  the procedure were encouraged and answered. A timeout was performed prior to the initiation of the procedure.  The right upper abdominal quadrant was prepped and draped in the usual sterile fashion, and a sterile drape was applied covering the operative field. Maximum barrier sterile technique with sterile gowns and gloves were used for the procedure.  Ultrasound scanning of the right upper abdominal quadrant was performed to delineate the anatomy and avoid transgression of the gallbladder or pleura. Local anesthesia was provided with 1% lidocaine. Under direct ultrasound guidance, a 21 gauge needle was advanced into a right lobe intrahepatic duct. Contrast injection was performed and cholangiography performed of the intrahepatic biliary tree.  A guidewire was advanced through the needle and an Accustick dilator advanced into the central bile ducts. The Accustick catheter was exchanged for a Kumpe catheter over a [employer] wire. With the use of a regular glide wire, the Kumpe catheter was advanced beyond the level of the common bile duct obstruction and into the duodenum.  Over an Amplatz stiff wire, the tract was dilated and a 10.2 [place] biliary drainage catheter was advanced with coil ultimately locked within the duodenum. Contrast was injected and a completion radiograph was obtained. The catheter was connected to a drainage bag which yielded the brisk return of clear bile. The catheter was secured to the skin with an interrupted suture and StatLock device. The patient tolerated the procedure well without immediate postprocedural complication.  FINDINGS: Cholangiography demonstrates a high-grade biliary  obstruction with massively dilated intrahepatic and central bile ducts present. There was beaking noted at the level of the common bile duct with significant obstruction present over the distal segment of the CBD. Based on appearance, there may be more than just an ampullary process causing biliary stricture which extends higher than expected for ampullary involvement alone.  After crossing the obstruction, the biliary drainage catheter was able to be formed in the duodenum with sideholes extending up into the central biliary tree. The catheter will be left to gravity drainage to allow bilirubin to decrease. Ultimately, after pending tissue biopsy results from endoscopic biopsies, the common bile duct stricture may be able to be treated with an internalized permanent biliary stent from current percutaneous access.  IMPRESSION: Cholangiogram confirms the presence of a high-grade biliary obstruction with obstruction at the level of the common bile duct. As above, the stricture of the common bile duct appears to be fairly long and there may be fairly extensive tumor causing constriction of the common bile duct. 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 However, a pancreatic neoplasm may also be present and additional characterization with MRI may be helpful. A 10 French internal/external biliary drainage catheter was able to be placed percutaneously across the level of biliary obstruction and into the duodenum. This will be left to gravity drainage. The common bile duct stricture may be amenable to internalized permanent biliary stent placement once the biliary tree has been allowed to decompress and pending endoscopic biopsy results.   Electronically Signed   By: Aletta Edouard M.D.   On: 04/16/2015 18:50     CBC  Recent Labs Lab 04/16/15 0127 04/17/15 0234 04/18/15 0442 04/19/15 0509 04/20/15 0517  WBC 16.9* 21.1* 24.7* 24.1* 25.1*  HGB 9.0* 8.3* 8.3* 7.7* 7.1*  HCT 25.7* 23.6* 24.4* 22.8* 21.2*  PLT  267 244 188 181 180  MCV 85.4 86.8 87.8 90.5 91.8  MCH 29.9 30.5 29.9 30.6 30.7  MCHC 35.0 35.2 34.0 33.8 33.5  RDW 16.0* 16.4* 16.2*  17.2* 17.7*    Chemistries   Recent Labs Lab 04/15/15 0126 04/15/15 0750  04/16/15 0127 04/16/15 1414 04/17/15 0234 04/18/15 0442 04/19/15 0509 04/20/15 0517  NA 130* 135  < > 138 140 137 136 136 134*  K 3.2* 3.2*  < > 3.0* 3.4* 2.8* 3.2* 4.1 4.0  CL 102 109  < > 102 106 98* 99* 104 101  CO2 11* 12*  < > 19* 19* 25 25 22  21*  GLUCOSE 99 86  < > 103* 93 149* 112* 103* 101*  BUN 100* 94*  < > 81* 78* 68* 51* 42* 34*  CREATININE 7.44* 6.32*  < > 6.08* 6.03* 5.52* 4.80* 4.36* 4.13*  CALCIUM 7.8* 7.7*  < > 8.0* 8.1* 7.9* 8.0* 8.5* 8.6*  MG 1.2* 1.6*  --   --   --   --   --  1.6* 2.1  AST 114*  --   --  72*  --  63* 32 30 39  ALT 79*  --   --  60*  --  49 35 30 33  ALKPHOS 682*  --   --  542*  --  562* 439* 335* 279*  BILITOT 40.8*  --   --  42.2*  --  38.5* 24.8* 19.8* 19.1*  < > = values in this interval not displayed. ------------------------------------------------------------------------------------------------------------------ estimated creatinine clearance is 11.9 mL/min (by C-G formula based on Cr of 4.13). ------------------------------------------------------------------------------------------------------------------ No results for input(s): HGBA1C in the last 72 hours. ------------------------------------------------------------------------------------------------------------------ No results for input(s): CHOL, HDL, LDLCALC, TRIG, CHOLHDL, LDLDIRECT in the last 72 hours. ------------------------------------------------------------------------------------------------------------------  Recent Labs  04/19/15 1258  TSH 0.398  T3FREE 0.4*   ------------------------------------------------------------------------------------------------------------------ No results for input(s): VITAMINB12, FOLATE, FERRITIN, TIBC, IRON, RETICCTPCT in  the last 72 hours.  Coagulation profile  Recent Labs Lab 04/15/15 0126 04/15/15 1330 04/16/15 0127 04/17/15 0234  INR 5.78* 1.23 1.08 1.12    No results for input(s): DDIMER in the last 72 hours.  Cardiac Enzymes  Recent Labs Lab 04/15/15 0126 04/15/15 0750 04/15/15 1330  TROPONINI 0.10* 0.05* 0.05*   ------------------------------------------------------------------------------------------------------------------ Invalid input(s): POCBNP   Time Spent in minutes  35   SINGH,PRASHANT K M.D on 04/20/2015 at 11:34 AM  Between 7am to 7pm - Pager - [phone]  After 7pm go to [email or web address] - password [facility]  [facility] -  Office  [phone]

## 2015-04-20 NOTE — Progress Notes (Signed)
Patient ID: Heather Harmon, female   DOB: 06/30/1946, 69 y.o.   MRN: [id number]     [facility]      [address] [place], [place] [address]    Phone: [phone] FAX: [phone]     Subjective: Jaundice.   Objective:  Vital signs:  Filed Vitals:   04/19/15 0538 04/19/15 1333 04/19/15 2142 04/20/15 0511  BP: 110/63 119/61 131/71 105/64  Pulse: 94 88 109 86  Temp: 98 F (36.7 C) 99.5 F (37.5 C) 98.8 F (37.1 C) 97.9 F (36.6 C)  TempSrc:  Oral Oral Oral  Resp: 20 24 18 18   Height:      Weight: 64.3 kg (141 lb 12.1 oz)   64.3 kg (141 lb 12.1 oz)  SpO2: 99%  98% 100%    Last BM Date: 04/18/15  Intake/Output   Yesterday:  07/25 0701 - 07/26 0700 In: 1295.8 [P.O.:960; I.V.:135.8; IV Piggyback:200] Out: 2300 [Urine:1350; Drains:800; Stool:150] This shift:  Total I/O In: 240 [P.O.:240] Out: 675 [Urine:75; Drains:600]  Physical Exam: General: Pt awake/alert/oriented x4 in no acute distress  Abdomen: Soft.  Nondistended. Non tender.  RUQ drain with bilious output.   No evidence of peritonitis.  No incarcerated hernias.    Problem List:   Active Problems:   Common bile duct obstruction   Intraabdominal mass   Severe sepsis   UTI (lower urinary tract infection)   Acute renal failure   Hypokalemia   Hypomagnesemia   Jaundice   Biliary tract obstruction    Results:   Labs: Results for orders placed or performed during the hospital encounter of 04/14/15 (from the past 48 hour(s))  TSH     Status: Abnormal   Collection Time: 04/18/15  1:45 PM  Result Value Ref Range   TSH 0.294 (L) 0.350 - 4.500 uIU/mL  Comprehensive metabolic panel     Status: Abnormal   Collection Time: 04/19/15  5:09 AM  Result Value Ref Range   Sodium 136 135 - 145 mmol/L   Potassium 4.1 3.5 - 5.1 mmol/L    Comment: DELTA CHECK NOTED   Chloride 104 101 - 111 mmol/L   CO2 22 22 - 32 mmol/L   Glucose, Bld 103 (H) 65 - 99 mg/dL   BUN  42 (H) 6 - 20 mg/dL   Creatinine, Ser 4.36 (H) 0.44 - 1.00 mg/dL   Calcium 8.5 (L) 8.9 - 10.3 mg/dL   Total Protein 5.3 (L) 6.5 - 8.1 g/dL   Albumin 1.6 (L) 3.5 - 5.0 g/dL   AST 30 15 - 41 U/L   ALT 30 14 - 54 U/L   Alkaline Phosphatase 335 (H) 38 - 126 U/L   Total Bilirubin 19.8 (HH) 0.3 - 1.2 mg/dL    Comment: REPEATED TO VERIFY CRITICAL VALUE NOTED.  VALUE IS CONSISTENT WITH PREVIOUSLY REPORTED AND CALLED VALUE.    GFR calc non Af Amer 10 (L) >60 mL/min   GFR calc Af Amer 11 (L) >60 mL/min    Comment: (NOTE) The eGFR has been calculated using the CKD EPI equation. This calculation has not been validated in all clinical situations. eGFR's persistently <60 mL/min signify possible Chronic Kidney Disease.    Anion gap 10 5 - 15  Magnesium     Status: Abnormal   Collection Time: 04/19/15  5:09 AM  Result Value Ref Range   Magnesium 1.6 (L) 1.7 - 2.4 mg/dL  CBC     Status: Abnormal  Collection Time: 04/19/15  5:09 AM  Result Value Ref Range   WBC 24.1 (H) 4.0 - 10.5 K/uL   RBC 2.52 (L) 3.87 - 5.11 MIL/uL   Hemoglobin 7.7 (L) 12.0 - 15.0 g/dL   HCT 22.8 (L) 36.0 - 46.0 %   MCV 90.5 78.0 - 100.0 fL   MCH 30.6 26.0 - 34.0 pg   MCHC 33.8 30.0 - 36.0 g/dL   RDW 17.2 (H) 11.5 - 15.5 %   Platelets 181 150 - 400 K/uL  TSH     Status: None   Collection Time: 04/19/15 12:58 PM  Result Value Ref Range   TSH 0.398 0.350 - 4.500 uIU/mL  T4, free     Status: None   Collection Time: 04/19/15 12:58 PM  Result Value Ref Range   Free T4 0.73 0.61 - 1.12 ng/dL  T3, free     Status: Abnormal   Collection Time: 04/19/15 12:58 PM  Result Value Ref Range   T3, Free 0.4 (L) 2.0 - 4.4 pg/mL    Comment: (NOTE) Performed At: [facility] [place], [place] [id number] Lindon Romp MD OI:[id number]   CBC     Status: Abnormal   Collection Time: 04/20/15  5:17 AM  Result Value Ref Range   WBC 25.1 (H) 4.0 - 10.5 K/uL    Comment: WHITE COUNT CONFIRMED ON SMEAR   RBC  2.31 (L) 3.87 - 5.11 MIL/uL   Hemoglobin 7.1 (L) 12.0 - 15.0 g/dL   HCT 21.2 (L) 36.0 - 46.0 %   MCV 91.8 78.0 - 100.0 fL   MCH 30.7 26.0 - 34.0 pg   MCHC 33.5 30.0 - 36.0 g/dL   RDW 17.7 (H) 11.5 - 15.5 %   Platelets 180 150 - 400 K/uL    Comment: PLATELET COUNT CONFIRMED BY SMEAR REPEATED TO VERIFY   Magnesium     Status: None   Collection Time: 04/20/15  5:17 AM  Result Value Ref Range   Magnesium 2.1 1.7 - 2.4 mg/dL  Comprehensive metabolic panel     Status: Abnormal   Collection Time: 04/20/15  5:17 AM  Result Value Ref Range   Sodium 134 (L) 135 - 145 mmol/L   Potassium 4.0 3.5 - 5.1 mmol/L   Chloride 101 101 - 111 mmol/L   CO2 21 (L) 22 - 32 mmol/L   Glucose, Bld 101 (H) 65 - 99 mg/dL   BUN 34 (H) 6 - 20 mg/dL   Creatinine, Ser 4.13 (H) 0.44 - 1.00 mg/dL   Calcium 8.6 (L) 8.9 - 10.3 mg/dL   Total Protein 5.2 (L) 6.5 - 8.1 g/dL   Albumin 1.6 (L) 3.5 - 5.0 g/dL   AST 39 15 - 41 U/L   ALT 33 14 - 54 U/L   Alkaline Phosphatase 279 (H) 38 - 126 U/L   Total Bilirubin 19.1 (HH) 0.3 - 1.2 mg/dL    Comment: REPEATED TO VERIFY CRITICAL VALUE NOTED.  VALUE IS CONSISTENT WITH PREVIOUSLY REPORTED AND CALLED VALUE.    GFR calc non Af Amer 10 (L) >60 mL/min   GFR calc Af Amer 12 (L) >60 mL/min    Comment: (NOTE) The eGFR has been calculated using the CKD EPI equation. This calculation has not been validated in all clinical situations. eGFR's persistently <60 mL/min signify possible Chronic Kidney Disease.    Anion gap 12 5 - 15    Imaging / Studies: Mr Abdomen Mrcp Wo Cm  04/20/2015   CLINICAL  DATA:  69 year old female with history of jaundice. 3-4 day history of shortness of breath with minimal exertion, progressively worsening. Multiple recent falls. Blood in the stool.  EXAM: MRI ABDOMEN WITHOUT CONTRAST  (INCLUDING MRCP)  TECHNIQUE: Multiplanar multisequence MR imaging of the abdomen was performed. Heavily T2-weighted images of the biliary and pancreatic ducts were  obtained, and three-dimensional MRCP images were rendered by post processing.  COMPARISON:  None.  FINDINGS: Comment: Today's study is limited for assessment of visceral and/or vascular lesions by lack of IV gadolinium. In addition, today's study is limited by considerable patient respiratory motion and respiratory variation.  Lower chest: Signal intensity in the dependent portion of the right lower lobe, concerning for area of aspiration pneumonitis or pneumonia.  Hepatobiliary: Widespread heterogeneous increased T2 signal intensity throughout the hepatic parenchyma, concerning for multifocal metastases. MRCP images are exceedingly limited by patient motion, however, no significant intrahepatic biliary ductal dilatation is appreciated at this time. A percutaneous biliary drainage catheter is noted, but poorly delineated on today's MRI examination. Large filling defect in the lumen of the gallbladder presumably reflects 1 or more large gallstones. In the region of the hepatic hilum there is a very poorly defined mass-like area which is best demonstrated on diffusion images where this lesion is high signal intensity (subsequently low signal intensity on the accompanying ADC map), concerning for large neoplasm. This is estimated to measure approximately 5.1 x 3.9 cm in size (image 22 of series 14). The inferior aspect of this mass is intimately associated with the superior aspect of the pancreatic head, and the adjacent first/second portion of the duodenum. Common bile duct is poorly demonstrated, but does not appear dilated at this time. Diffuse increased T2 signal intensity adjacent to the portal triads, indicative of periportal edema.  Pancreas: As discussed above, the large mass-like area centered in the porta hepatis encroaches upon the superior aspect of the pancreatic head. Whether not this mass arises from the pancreatic head is uncertain on today's examination. Body and tail of the pancreas are normal in  appearance on today's noncontrast MRI examination. Specifically, no ductal dilatation.  Spleen: Unremarkable.  Adrenals/Urinary Tract: Bilateral adrenal glands and bilateral kidneys are normal in appearance.  Stomach/Bowel: As mentioned above, the large mass-like area centered in the porta hepatis is intimately associated with the first/second portion of the duodenum. Remaining portions of visualized small bowel and colon are unremarkable.  Vascular/Lymphatic: No definite aneurysm identified in the visualized abdominal vasculature. Flow void is noted within the superior mesenteric vein, splenic vein, portal vein and main portal vein branches bilaterally. Prominent soft tissue in the porta hepatis may include some nodal tissue. No other lymphadenopathy is noted elsewhere in the abdomen on today's noncontrast MRI examination.  Other: Trace volume of ascites.  Musculoskeletal: No aggressive osseous lesion identified in the visualized portions of the skeleton on today's noncontrast MRI examination.  IMPRESSION: 1. Very limited study secondary to respiratory motion and lack of IV contrast. With these limitations in mind, there is a large mass centered in the region of the porta hepatis, which likely accounts for the recently discovered biliary tract obstruction. No residual intrahepatic biliary ductal dilatation following placement of percutaneous drainage catheter. There are innumerable hepatic lesions, suggesting widespread metastatic disease to the liver. The mass in the porta hepatis is intimately associated with the superior aspect of the pancreatic head, as well as the first/second portion of the duodenum, which could indicate either primary hepatic, biliary, pancreatic or duodenal lesion (favored to be  primary hepatobiliary). 2. Cholelithiasis. 3. Small volume of ascites.   Electronically Signed   By: Vinnie Langton M.D.   On: 04/20/2015 08:00    Medications / Allergies:  Scheduled Meds: . cholestyramine  4  g Oral BID  . ferrous sulfate  325 mg Oral Q breakfast  . heparin subcutaneous  5,000 Units Subcutaneous 3 times per day  . loperamide  2 mg Oral QID  . pantoprazole  40 mg Oral Q0600  . piperacillin-tazobactam (ZOSYN)  IV  2.25 g Intravenous Q8H  . saccharomyces boulardii  250 mg Oral BID   Continuous Infusions: . sodium chloride 50 mL/hr at 04/20/15 0417   PRN Meds:.sodium chloride, metoprolol  Antibiotics: Anti-infectives    Start     Dose/Rate Route Frequency Ordered Stop   04/17/15 1500  piperacillin-tazobactam (ZOSYN) IVPB 2.25 g     2.25 g 100 mL/hr over 30 Minutes Intravenous Every 8 hours 04/17/15 0852     04/16/15 1430  ciprofloxacin (CIPRO) IVPB 400 mg    Comments:  Hang ON CALL to IR 7/22   400 mg 200 mL/hr over 60 Minutes Intravenous To Short Stay 04/16/15 1338 04/16/15 1735   04/15/15 0800  piperacillin-tazobactam (ZOSYN) IVPB 2.25 g  Status:  Discontinued     2.25 g 100 mL/hr over 30 Minutes Intravenous Every 8 hours 04/15/15 0036 04/17/15 0844   04/15/15 0045  piperacillin-tazobactam (ZOSYN) IVPB 2.25 g     2.25 g 100 mL/hr over 30 Minutes Intravenous  Once 04/15/15 0036 04/15/15 0130        Assessment/Plan CBD obstruction, 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 Unfortunately, she is not a surgical candidate with MRCP findings.  Discussed with Dr. Barry Dienes.  Further recs per oncology.  Zosyn Will follow  Erby Pian, [facility] Pager [phone]) For consults and floor pages call [phone](7A-4:30P)  04/20/2015 10:48 AM

## 2015-04-21 DIAGNOSIS — R531 Weakness: Secondary | ICD-10-CM | POA: Insufficient documentation

## 2015-04-21 LAB — CBC
HCT: 26.6 % — ABNORMAL LOW (ref 36.0–46.0)
HEMOGLOBIN: 8.8 g/dL — AB (ref 12.0–15.0)
MCH: 30.8 pg (ref 26.0–34.0)
MCHC: 33.1 g/dL (ref 30.0–36.0)
MCV: 93 fL (ref 78.0–100.0)
Platelets: 194 10*3/uL (ref 150–400)
RBC: 2.86 MIL/uL — ABNORMAL LOW (ref 3.87–5.11)
RDW: 17.9 % — AB (ref 11.5–15.5)
WBC: 30.5 10*3/uL — AB (ref 4.0–10.5)

## 2015-04-21 NOTE — Progress Notes (Signed)
Discussed with pt and family her code status and any comfort measures.  Pt asked if she wanted to be resuscitated if her heart and breathing stops.  Pt stated "I don't want my chest bones broken."  When asked if she wanted a feeding tube if she reached a point where she could no longer eat.  Pt stated "I don't want any of that."  Will page Dr. Candiss Norse.  Will continue to monitor.

## 2015-04-21 NOTE — Progress Notes (Signed)
Referring Physician(s): Dr. Olevia Perches  Chief Complaint:  F/u for Biliary drain placed 04/16/2015  Subjective:  Ms Heather Harmon is doing well today and is eager to go home.  She was seen by palliative care and oncology and she has a poor prognosis.  She has no complaints today.  She is in good spirits and smiling despite her poor prognosis.  Her family is at bedside.  Allergies: Review of patient's allergies indicates no known allergies.  Medications: Prior to Admission medications   Not on File     Vital Signs: BP 97/60 mmHg  Pulse 108  Temp(Src) 97.8 F (36.6 C) (Oral)  Resp 18  Ht 5\' 5"  (1.651 m)  Wt 140 lb 10.5 oz (63.8 kg)  BMI 23.41 kg/m2  SpO2 100%  Physical Exam   Awake and Alert Icterus remains present Abdomen soft, NTND Drain in place, looks good, intact Output ~2050 bilious drainage  Imaging: Mr Abdomen Mrcp Wo Cm  04/20/2015   CLINICAL DATA:  69 year old female with history of jaundice. 3-4 day history of shortness of breath with minimal exertion, progressively worsening. Multiple recent falls. Blood in the stool.  EXAM: MRI ABDOMEN WITHOUT CONTRAST  (INCLUDING MRCP)  TECHNIQUE: Multiplanar multisequence MR imaging of the abdomen was performed. Heavily T2-weighted images of the biliary and pancreatic ducts were obtained, and three-dimensional MRCP images were rendered by post processing.  COMPARISON:  None.  FINDINGS: Comment: Today's study is limited for assessment of visceral and/or vascular lesions by lack of IV gadolinium. In addition, today's study is limited by considerable patient respiratory motion and respiratory variation.  Lower chest: Signal intensity in the dependent portion of the right lower lobe, concerning for area of aspiration pneumonitis or pneumonia.  Hepatobiliary: Widespread heterogeneous increased T2 signal intensity throughout the hepatic parenchyma, concerning for multifocal metastases. MRCP images are exceedingly limited by patient  motion, however, no significant intrahepatic biliary ductal dilatation is appreciated at this time. A percutaneous biliary drainage catheter is noted, but poorly delineated on today's MRI examination. Large filling defect in the lumen of the gallbladder presumably reflects 1 or more large gallstones. In the region of the hepatic hilum there is a very poorly defined mass-like area which is best demonstrated on diffusion images where this lesion is high signal intensity (subsequently low signal intensity on the accompanying ADC map), concerning for large neoplasm. This is estimated to measure approximately 5.1 x 3.9 cm in size (image 22 of series 14). The inferior aspect of this mass is intimately associated with the superior aspect of the pancreatic head, and the adjacent first/second portion of the duodenum. Common bile duct is poorly demonstrated, but does not appear dilated at this time. Diffuse increased T2 signal intensity adjacent to the portal triads, indicative of periportal edema.  Pancreas: As discussed above, the large mass-like area centered in the porta hepatis encroaches upon the superior aspect of the pancreatic head. Whether not this mass arises from the pancreatic head is uncertain on today's examination. Body and tail of the pancreas are normal in appearance on today's noncontrast MRI examination. Specifically, no ductal dilatation.  Spleen: Unremarkable.  Adrenals/Urinary Tract: Bilateral adrenal glands and bilateral kidneys are normal in appearance.  Stomach/Bowel: As mentioned above, the large mass-like area centered in the porta hepatis is intimately associated with the first/second portion of the duodenum. Remaining portions of visualized small bowel and colon are unremarkable.  Vascular/Lymphatic: No definite aneurysm identified in the visualized abdominal vasculature. Flow void is noted within the superior mesenteric  vein, splenic vein, portal vein and main portal vein branches bilaterally.  Prominent soft tissue in the porta hepatis may include some nodal tissue. No other lymphadenopathy is noted elsewhere in the abdomen on today's noncontrast MRI examination.  Other: Trace volume of ascites.  Musculoskeletal: No aggressive osseous lesion identified in the visualized portions of the skeleton on today's noncontrast MRI examination.  IMPRESSION: 1. Very limited study secondary to respiratory motion and lack of IV contrast. With these limitations in mind, there is a large mass centered in the region of the porta hepatis, which likely accounts for the recently discovered biliary tract obstruction. No residual intrahepatic biliary ductal dilatation following placement of percutaneous drainage catheter. There are innumerable hepatic lesions, suggesting widespread metastatic disease to the liver. The mass in the porta hepatis is intimately associated with the superior aspect of the pancreatic head, as well as the first/second portion of the duodenum, which could indicate either primary hepatic, biliary, pancreatic or duodenal lesion (favored to be primary hepatobiliary). 2. Cholelithiasis. 3. Small volume of ascites.   Electronically Signed   By: Vinnie Langton M.D.   On: 04/20/2015 08:00    Labs:  CBC:  Recent Labs  04/18/15 0442 04/19/15 0509 04/20/15 0517 04/21/15 0546  WBC 24.7* 24.1* 25.1* 30.5*  HGB 8.3* 7.7* 7.1* 8.8*  HCT 24.4* 22.8* 21.2* 26.6*  PLT 188 181 180 194    COAGS:  Recent Labs  04/15/15 0126 04/15/15 1330 04/15/15 1646 04/16/15 0127 04/17/15 0234  INR 5.78* 1.23  --  1.08 1.12  APTT  --   --  26  --   --     BMP:  Recent Labs  04/17/15 0234 04/18/15 0442 04/19/15 0509 04/20/15 0517  NA 137 136 136 134*  K 2.8* 3.2* 4.1 4.0  CL 98* 99* 104 101  CO2 25 25 22  21*  GLUCOSE 149* 112* 103* 101*  BUN 68* 51* 42* 34*  CALCIUM 7.9* 8.0* 8.5* 8.6*  CREATININE 5.52* 4.80* 4.36* 4.13*  GFRNONAA 7* 9* 10* 10*  GFRAA 8* 10* 11* 12*    LIVER FUNCTION  TESTS:  Recent Labs  04/17/15 0234 04/18/15 0442 04/19/15 0509 04/20/15 0517  BILITOT 38.5* 24.8* 19.8* 19.1*  AST 63* 32 30 39  ALT 49 35 30 33  ALKPHOS 562* 439* 335* 279*  PROT 5.6* 5.6* 5.3* 5.2*  ALBUMIN 1.8* 1.7* 1.6* 1.6*    Assessment and Plan:  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  Recommend Hospice nurse or [place] RN for drain care  Since patient is palliative/hospice care now, no need for f/u with IR.  Feel free to contact us if any questions arise.  Signed: Murrell Redden PA-C 04/21/2015, 10:26 AM   I spent a total of 15 Minutes in face to face in clinical consultation/evaluation, greater than 50% of which was counseling/coordinating care for f/u for biliary drain.

## 2015-04-21 NOTE — Discharge Instructions (Signed)
Follow with Primary MD [name],[name], MD in 7 days   Get CBC, CMP, 2 view Chest X ray checked  by Primary MD next visit.    Activity: As tolerated with Full fall precautions use walker/cane & assistance as needed   Disposition Home     Diet: Heart Healthy    For Heart failure patients - Check your Weight same time everyday, if you gain over 2 pounds, or you develop in leg swelling, experience more shortness of breath or chest pain, call your Primary MD immediately. Follow Cardiac Low Salt Diet and 1.5 lit/day fluid restriction.   On your next visit with your primary care physician please Get Medicines reviewed and adjusted.   Please request your Prim.MD to go over all Hospital Tests and Procedure/Radiological results at the follow up, please get all Hospital records sent to your Prim MD by signing hospital release before you go home.   If you experience worsening of your admission symptoms, develop shortness of breath, life threatening emergency, suicidal or homicidal thoughts you must seek medical attention immediately by calling 911 or calling your MD immediately  if symptoms less severe.  You Must read complete instructions/literature along with all the possible adverse reactions/side effects for all the Medicines you take and that have been prescribed to you. Take any new Medicines after you have completely understood and accpet all the possible adverse reactions/side effects.   Do not drive, operating heavy machinery, perform activities at heights, swimming or participation in water activities or provide baby sitting services if your were admitted for syncope or siezures until you have seen by Primary MD or a Neurologist and advised to do so again.  Do not drive when taking Pain medications.    Do not take more than prescribed Pain, Sleep and Anxiety Medications  Special Instructions: If you have smoked or chewed Tobacco  in the last 2 yrs please stop smoking, stop any regular  Alcohol  and or any Recreational drug use.  Wear Seat belts while driving.   Please note  You were cared for by a hospitalist during your hospital stay. If you have any questions about your discharge medications or the care you received while you were in the hospital after you are discharged, you can call the unit and asked to speak with the hospitalist on call if the hospitalist that took care of you is not available. Once you are discharged, your primary care physician will handle any further medical issues. Please note that NO REFILLS for any discharge medications will be authorized once you are discharged, as it is imperative that you return to your primary care physician (or establish a relationship with a primary care physician if you do not have one) for your aftercare needs so that they can reassess your need for medications and monitor your lab values.

## 2015-04-21 NOTE — Evaluation (Signed)
Physical Therapy Evaluation Patient Details Name: Heather Harmon MRN: [id number] DOB: [birth year] Today's Date: 04/21/2015   History of Present Illness  69 year old female with no known medical history reports "yellow eyes" for about one month now. About 3-4 days ago she began to have SOB with even minimal exertion which was progressively getting worse. 7/19 late PM she had several episodes of vomiting, and 7/20 she had weakness with multiple falls.   Clinical Impression  Patient demonstrates deficits in functional mobility as indicated below. Will need continued skilled PT to address deficits and maximize function. Will see as indicated and progress as tolerated.     Follow Up Recommendations Home health PT;Supervision/Assistance - 24 hour    Equipment Recommendations  None recommended by PT (famiyl has home DME)   Recommendations for Other Services       Precautions / Restrictions Precautions Precautions: Fall Precaution Comments: patient with perc drain right flank Restrictions Weight Bearing Restrictions: No      Mobility  Bed Mobility               General bed mobility comments: received in chair  Transfers Overall transfer level: Needs assistance Equipment used: None Transfers: Sit to/from Stand Sit to Stand: Min guard         General transfer comment: no physical assist required, min guard for stability upon coming to standing  Ambulation/Gait Ambulation/Gait assistance: Min guard;Min assist Ambulation Distance (Feet): 240 Feet Assistive device: None Gait Pattern/deviations: Step-through pattern;Decreased stride length;Drifts right/left;Narrow base of support Gait velocity: decreased Gait velocity interpretation: Below normal speed for age/gender General Gait Details: patient with some noted instability during ambulation, min assist for one noted LOB otherwise min guard/supervision.  Stairs            Wheelchair Mobility    Modified Rankin  (Stroke Patients Only)       Balance Overall balance assessment: History of Falls                                           Pertinent Vitals/Pain Pain Assessment: No/denies pain    Home Living Family/patient expects to be discharged to:: Private residence Living Arrangements: Children Available Help at Discharge: Family;Available 24 hours/day Type of Home: House Home Access: Stairs to enter Entrance Stairs-Rails: None Entrance Stairs-Number of Steps: 3 Home Layout: One level Home Equipment: None      Prior Function Level of Independence: Independent         Comments: history of recent falls     Hand Dominance   Dominant Hand: Right    Extremity/Trunk Assessment   Upper Extremity Assessment: Generalized weakness           Lower Extremity Assessment: Generalized weakness      Cervical / Trunk Assessment: Kyphotic  Communication   Communication: No difficulties  Cognition Arousal/Alertness: Awake/alert Behavior During Therapy: WFL for tasks assessed/performed Overall Cognitive Status: Within Functional Limits for tasks assessed                      General Comments      Exercises        Assessment/Plan    PT Assessment Patient needs continued PT services  PT Diagnosis Difficulty walking;Generalized weakness   PT Problem List Decreased strength;Decreased activity tolerance;Decreased balance;Decreased mobility  PT Treatment Interventions DME instruction;Gait training;Stair training;Functional mobility training;Therapeutic activities;Therapeutic exercise;Balance  training;Patient/family education   PT Goals (Current goals can be found in the Care Plan section) Acute Rehab PT Goals PT Goal Formulation: With patient/family Time For Goal Achievement: 05/05/15 Potential to Achieve Goals: Good    Frequency Min 3X/week   Barriers to discharge        Co-evaluation               End of Session Equipment Utilized  During Treatment: Gait belt Activity Tolerance: Patient tolerated treatment well Patient left: in chair;with call bell/phone within reach;with chair alarm set;with family/visitor present Nurse Communication: Mobility status         Time: 1012-1029 PT Time Calculation (min) (ACUTE ONLY): 17 min   Charges:   PT Evaluation $Initial PT Evaluation Tier I: 1 Procedure     PT G CodesDuncan Dull 05/05/2015, 11:54 AM Alben Deeds, PT DPT  [phone]

## 2015-04-21 NOTE — Care Management Note (Addendum)
Case Management Note  Patient Details  Name: Heather Harmon MRN: [id number] Date of Birth: June 25, 1946  Subjective/Objective:                 Patient from home, will be discharged to home with daughter in [place], [place]. Patient's daughter has DME cane at home, her husband passed away four months ago. Family chose to have [name] (provided care for daughter's husband) and are ordered [name] PT OT RN SW Aide. Patient may need [name] care and palliative chemo due to CA dx in near future. Patient does not have access for home infusion at this time. [name] RN CM [name] notified of referral 04-21-15 11:40. AddendumArville Go would not accept patient based on insurance, Referral made to [facility] with [facility].     Action/Plan:  DC to home with HH.  Expected Discharge Date:                  Expected Discharge Plan:  [name]  In-House Referral:     Discharge planning Services  CM Consult  Post Acute Care Choice:  Home Health Choice offered to:  Patient, Adult Children  DME Arranged:    DME Agency:     HH Arranged:  RN, PT, OT, Nurse's Aide (Education officer, museum) [place]:  [place]  Status of Service:  Completed, signed off  Medicare Important Message Given:  Yes-second notification given Date Medicare IM Given:    Medicare IM give by:    Date Additional Medicare IM Given:    Additional Medicare Important Message give by:     If discussed at [place] of Stay Meetings, dates discussed:    Additional Comments:  [name], RN 04/21/2015, 11:32 AM

## 2015-04-21 NOTE — Care Management Important Message (Signed)
Important Message  Patient Details  Name: Heather Harmon MRN: [id number] Date of Birth: [birth year]   Medicare Important Message Given:  Yes-third notification given    Nathen May 04/21/2015, 12:12 [place] Message  Patient Details  Name: Heather Harmon MRN: [id number] Date of Birth: [birth year]   Medicare Important Message Given:  Yes-third notification given    Nathen May 04/21/2015, 12:11 PM

## 2015-04-21 NOTE — Progress Notes (Signed)
Referring Physician(Heather): Dr. Olevia Perches  Chief Complaint:  F/u for Biliary drain placed 04/16/2015  Subjective:  Heather Harmon is doing better.  She has no complaints today.  Her family is at bedside.  Denies pain at drain site  Allergies: Review of patient'Heather allergies indicates no known allergies.  Medications: Prior to Admission medications   Not on File     Vital Signs: BP 97/60 mmHg  Pulse 108  Temp(Src) 97.8 F (36.6 C) (Oral)  Resp 18  Ht 5\' 5"  (1.651 m)  Wt 140 lb 10.5 oz (63.8 kg)  BMI 23.41 kg/m2  SpO2 100%  Physical Exam   Awake and Alert Icterus remains present Abdomen soft, NTND Drain in place, looks good, intact Output ~800 bilious drainage  Imaging: Mr Abdomen Mrcp Wo Cm  04/20/2015   CLINICAL DATA:  69 year old female with history of jaundice. 3-4 day history of shortness of breath with minimal exertion, progressively worsening. Multiple recent falls. Blood in the stool.  EXAM: MRI ABDOMEN WITHOUT CONTRAST  (INCLUDING MRCP)  TECHNIQUE: Multiplanar multisequence MR imaging of the abdomen was performed. Heavily T2-weighted images of the biliary and pancreatic ducts were obtained, and three-dimensional MRCP images were rendered by post processing.  COMPARISON:  None.  FINDINGS: Comment: Today'Heather study is limited for assessment of visceral and/or vascular lesions by lack of IV gadolinium. In addition, today'Heather study is limited by considerable patient respiratory motion and respiratory variation.  Lower chest: Signal intensity in the dependent portion of the right lower lobe, concerning for area of aspiration pneumonitis or pneumonia.  Hepatobiliary: Widespread heterogeneous increased T2 signal intensity throughout the hepatic parenchyma, concerning for multifocal metastases. MRCP images are exceedingly limited by patient motion, however, no significant intrahepatic biliary ductal dilatation is appreciated at this time. A percutaneous biliary drainage catheter is  noted, but poorly delineated on today'Heather MRI examination. Large filling defect in the lumen of the gallbladder presumably reflects 1 or more large gallstones. In the region of the hepatic hilum there is a very poorly defined mass-like area which is best demonstrated on diffusion images where this lesion is high signal intensity (subsequently low signal intensity on the accompanying ADC map), concerning for large neoplasm. This is estimated to measure approximately 5.1 x 3.9 cm in size (image 22 of series 14). The inferior aspect of this mass is intimately associated with the superior aspect of the pancreatic head, and the adjacent first/second portion of the duodenum. Common bile duct is poorly demonstrated, but does not appear dilated at this time. Diffuse increased T2 signal intensity adjacent to the portal triads, indicative of periportal edema.  Pancreas: As discussed above, the large mass-like area centered in the porta hepatis encroaches upon the superior aspect of the pancreatic head. Whether not this mass arises from the pancreatic head is uncertain on today'Heather examination. Body and tail of the pancreas are normal in appearance on today'Heather noncontrast MRI examination. Specifically, no ductal dilatation.  Spleen: Unremarkable.  Adrenals/Urinary Tract: Bilateral adrenal glands and bilateral kidneys are normal in appearance.  Stomach/Bowel: As mentioned above, the large mass-like area centered in the porta hepatis is intimately associated with the first/second portion of the duodenum. Remaining portions of visualized small bowel and colon are unremarkable.  Vascular/Lymphatic: No definite aneurysm identified in the visualized abdominal vasculature. Flow void is noted within the superior mesenteric vein, splenic vein, portal vein and main portal vein branches bilaterally. Prominent soft tissue in the porta hepatis may include some nodal tissue. No other lymphadenopathy is noted  elsewhere in the abdomen on today'Heather  noncontrast MRI examination.  Other: Trace volume of ascites.  Musculoskeletal: No aggressive osseous lesion identified in the visualized portions of the skeleton on today'Heather noncontrast MRI examination.  IMPRESSION: 1. Very limited study secondary to respiratory motion and lack of IV contrast. With these limitations in mind, there is a large mass centered in the region of the porta hepatis, which likely accounts for the recently discovered biliary tract obstruction. No residual intrahepatic biliary ductal dilatation following placement of percutaneous drainage catheter. There are innumerable hepatic lesions, suggesting widespread metastatic disease to the liver. The mass in the porta hepatis is intimately associated with the superior aspect of the pancreatic head, as well as the first/second portion of the duodenum, which could indicate either primary hepatic, biliary, pancreatic or duodenal lesion (favored to be primary hepatobiliary). 2. Cholelithiasis. 3. Small volume of ascites.   Electronically Signed   By: Vinnie Langton M.D.   On: 04/20/2015 08:00    Labs:  CBC:  Recent Labs  04/18/15 0442 04/19/15 0509 04/20/15 0517 04/21/15 0546  WBC 24.7* 24.1* 25.1* 30.5*  HGB 8.3* 7.7* 7.1* 8.8*  HCT 24.4* 22.8* 21.2* 26.6*  PLT 188 181 180 194    COAGS:  Recent Labs  04/15/15 0126 04/15/15 1330 04/15/15 1646 04/16/15 0127 04/17/15 0234  INR 5.78* 1.23  --  1.08 1.12  APTT  --   --  26  --   --     BMP:  Recent Labs  04/17/15 0234 04/18/15 0442 04/19/15 0509 04/20/15 0517  NA 137 136 136 134*  K 2.8* 3.2* 4.1 4.0  CL 98* 99* 104 101  CO2 25 25 22  21*  GLUCOSE 149* 112* 103* 101*  BUN 68* 51* 42* 34*  CALCIUM 7.9* 8.0* 8.5* 8.6*  CREATININE 5.52* 4.80* 4.36* 4.13*  GFRNONAA 7* 9* 10* 10*  GFRAA 8* 10* 11* 12*    LIVER FUNCTION TESTS:  Recent Labs  04/17/15 0234 04/18/15 0442 04/19/15 0509 04/20/15 0517  BILITOT 38.5* 24.8* 19.8* 19.1*  AST 63* 32 30 39  ALT  49 35 30 33  ALKPHOS 562* 439* 335* 279*  PROT 5.6* 5.6* 5.3* 5.2*  ALBUMIN 1.8* 1.7* 1.6* 1.6*    Assessment and Plan:  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

## 2015-04-21 NOTE — Progress Notes (Signed)
Daily Progress Note   Patient Name: Heather Harmon       Date: 04/21/2015 DOB: Jan 08, 1946  Age: 69 y.o. 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   Subjective:  Patient is resting in chair, there are several family members at the bedside.    Recommendations/Interval Events:  Dr Keturah Barre note reviewed, chart reviewed.  Patient currently meeting with family, she states she would like to go home soon, she will be moving in with her sister.  Briefly discussed with patient, she states she has received all information regarding her presumed malignancy, her prognosis etc. She smiles, she doesn't want to discuss in front of her family. I gave her my card, palliative care will be appropriate in case she has future re hospitalizations. For now, it is assumed that she would go home with home health care, follow up with Oncology, if she is not able to be a candidate for palliative chemotherapy, then she will need hospice enrollment.    Length of Stay: 7 days  Current Medications: Scheduled Meds:  . cholestyramine  4 g Oral BID  . ferrous sulfate  325 mg Oral Q breakfast  . heparin subcutaneous  5,000 Units Subcutaneous 3 times per day  . loperamide  2 mg Oral QID  . pantoprazole  40 mg Oral Q0600  . saccharomyces boulardii  250 mg Oral BID    Continuous Infusions: . sodium chloride 50 mL/hr at 04/21/15 0603    PRN Meds: sodium chloride, metoprolol  Palliative Performance Scale: 30%     Vital Signs: BP 97/60 mmHg  Pulse 108  Temp(Src) 97.8 F (36.6 C) (Oral)  Resp 18  Ht 5\' 5"  (1.651 m)  Wt 63.8 kg (140 lb 10.5 oz)  BMI 23.41 kg/m2  SpO2 100% SpO2: SpO2: 100 % O2 Device: O2 Device: Not Delivered O2 Flow  Rate: O2 Flow Rate (L/min): 2 L/min  Intake/output summary:  Intake/Output Summary (Last 24 hours) at 04/21/15 1059 Last data filed at 04/21/15 0940  Gross per 24 hour  Intake   1940 ml  Output   2650 ml  Net   -710 ml   LBM:   Baseline Weight: Weight: 60.4 kg (133 lb 2.5 oz) Most recent weight: Weight: 63.8 kg (140 lb 10.5 oz)  Physical Exam:    Weak frail AA lady icteric sclera Clear S1S2 Abdomen soft Awake alert NAD       Additional Data Reviewed: Recent Labs     04/19/15  0509  04/20/15  0517  04/21/15  0546  WBC  24.1*  25.1*  30.5*  HGB  7.7*  7.1*  8.8*  PLT  181  180  194  NA  136  134*   --   BUN  42*  34*   --   CREATININE  4.36*  4.13*   --      Problem List:  Patient Active Problem List   Diagnosis Date Noted  . Cholangiocarcinoma   . Palliative care encounter   . Jaundice   . Biliary tract obstruction   . Common bile duct obstruction 04/15/2015  . Intraabdominal mass 04/15/2015  . Severe sepsis 04/15/2015  . UTI (lower urinary tract infection) 04/15/2015  . Acute renal failure 04/15/2015  . Hypokalemia 04/15/2015  . Hypomagnesemia 04/15/2015  Palliative Care Assessment & Plan    Code Status:  Full code  Goals of Care:   return home with home health care, follow up with oncology  Desire for further Chaplaincy support:no  3. Symptom Management:   none currently  4. Palliative Prophylaxis:  Stool Softener: yes  5. Prognosis: < 6 months  5. Discharge Planning: Home with [place] was discussed with  Patient, her sister and several other family members.   Thank you for allowing the Palliative Medicine Team to assist in the care of this patient.   Time In: 1030 Time Out: 1055 Total Time 25 Prolonged Time Billed  no     Greater than 50%  of this time was spent counseling and coordinating care related to the above assessment and plan.  [place], MD  04/21/2015, 10:59 AM  Please contact  Palliative Medicine Team phone at [phone] for questions and concerns.

## 2015-04-21 NOTE — Progress Notes (Signed)
Patient Demographics:    Heather Harmon, is a 69 y.o. female, DOB - Sep 14, 1946, LKJ:[id number]  Admit date - 04/14/2015   Admitting Physician Rush Farmer, MD  Outpatient Primary MD for the patient is [name],[name], MD  LOS - 7   No chief complaint on file.     Summary  69 year old female with no known medical history reports "yellow eyes" for about one month now. About 3-4 days ago she began to have SOB with even minimal exertion which was progressively getting worse. 7/19 late PM she had several episodes of vomiting, and 7/20 she had weakness with multiple falls. She also had several loose stools that were noted to be bloody with clots by family. For these reasons she presented to the ER 7/20. Given her presentation she was sent for a CT scan of the abdomen and pelvis, the results of which are described above. 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   While in ED she developed sinus tachycardia with rates up to the 160s, which resolved with IV metoprolol. She had an abdominal ultrasound as well, however, those results are unavailable to me at this time. She was transferred to [facility] for further evaluation.   She was admitted by pulmonary critical care, she was also seen by GI, she underwent EGD with CBD mass biopsy along with bili drain placement by IR. She was transferred to hospitalist service under my care on 04/18/2015.    Subjective:    Heather Harmon today has, No headache, No chest pain, No abdominal pain - No Nausea, No new weakness tingling or numbness, No Cough - SOB.  Feels fine.   Assessment  & Plan :   1. 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 GI and IR on board, EGD has been done with biopsy of the suspicious mass along with CBD drain placement by IR. IR eventually wants to place a drain in the CBD. There was also suspicion for cholecystitis. Has finished her trial of IV Zosyn by GI and pulmonary critical care. 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 Per general surgery she is not a surgical candidate for any surgical procedure. Oncology wants to follow outpatient and they have seen the patient here. Note all treatment is going to be simply palliative.  Overall prognosis appears very poor, will involve palliative care, will GI decide on any further procedures for diagnostic workup.     2. Leukocytosis. Question reactionary due to #1 above. Doubt any active cholecystitis, has CBD drain, C. difficile negative, x-ray and UA unremarkable, blood cultures 2 sets negative. She has been afebrile, Zosyn stopped. Continue to monitor clinically. Remains afebrile.   3. Runs of SVT in ICU. Resolved, TSH is borderline low will repeat TSH and Echo stable,  monitor.   4. ARF. Renal following. Non-oliguric. Likely will avoid dialysis. Gradually improving renal function.     5. Coagulopathy with GI blood loss. Received vitamin K, 7 units of FFP and 1 unit of packed RBC in ICU, stable INR and H&H now   6. Hypokalemia  and hypomagnesemia. Replaced and monitored.     Code Status : Full  Family Communication  : None Present  Disposition Plan  : Tomorrow after plan formalized by palliative care. Home hospice versus home health  Consults  :    IR, GI, CCS, PCCM, Oncology  Procedures  :   CT abd/pelvis 7/20 > Significant intrahepatic and proximal extrahepatic biliary dilation and questionably obstructing mass lesion at the CBD concerning for cholangiocarcinoma. Contracted gallbladder containing high  attenuation material. Enlarged perihepatic lymph node.  US abdomen 7/20 > Results from [place] not available at this time.  EGD 7/22 (Dr Olevia Perches) >> duodenal mass in peri-ampulary area, biopsied  Biliary drain in place 7/22        DVT Prophylaxis  : Heparin   Lab Results  Component Value Date   PLT 194 04/21/2015    Inpatient Medications  Scheduled Meds: . cholestyramine  4 g Oral BID  . ferrous sulfate  325 mg Oral Q breakfast  . heparin subcutaneous  5,000 Units Subcutaneous 3 times per day  . loperamide  2 mg Oral QID  . pantoprazole  40 mg Oral Q0600  . saccharomyces boulardii  250 mg Oral BID   Continuous Infusions: . sodium chloride 50 mL/hr at 04/21/15 0603   PRN Meds:.sodium chloride, metoprolol  Antibiotics  :     Anti-infectives    Start     Dose/Rate Route Frequency Ordered Stop   04/17/15 1500  piperacillin-tazobactam (ZOSYN) IVPB 2.25 g  Status:  Discontinued     2.25 g 100 mL/hr over 30 Minutes Intravenous Every 8 hours 04/17/15 0852 04/21/15 0934   04/16/15 1430  ciprofloxacin (CIPRO) IVPB 400 mg    Comments:  Hang ON CALL to IR 7/22   400 mg 200 mL/hr over 60 Minutes Intravenous To Short Stay 04/16/15 1338 04/16/15 1735   04/15/15 0800  piperacillin-tazobactam (ZOSYN) IVPB 2.25 g  Status:  Discontinued     2.25 g 100 mL/hr over 30 Minutes Intravenous Every 8 hours 04/15/15 0036 04/17/15 0844   04/15/15 0045  piperacillin-tazobactam (ZOSYN) IVPB 2.25 g     2.25 g 100 mL/hr over 30 Minutes Intravenous  Once 04/15/15 0036 04/15/15 0130        Objective:   Filed Vitals:   04/20/15 1422 04/20/15 2034 04/21/15 0532 04/21/15 0536  BP: 128/66 135/62  97/60  Pulse: 99 107  108  Temp: 98.3 F (36.8 C) 99.3 F (37.4 C)  97.8 F (36.6 C)  TempSrc: Oral Oral  Oral  Resp: 16 18  18   Height:      Weight:   63.8 kg (140 lb 10.5 oz)   SpO2: 97% 100%  100%    Wt Readings from Last 3 Encounters:  04/21/15 63.8 kg (140 lb 10.5 oz)      Intake/Output Summary (Last 24 hours) at 04/21/15 0934 Last data filed at 04/21/15 0826  Gross per 24 hour  Intake   2180 ml  Output   3000 ml  Net   -820 ml     Physical Exam  Awake Alert, Oriented X 3, No new F.N deficits, Normal affect [place].AT,PERRAL, frank jaundice and icterus Supple Neck,No JVD, No cervical lymphadenopathy appriciated.  Symmetrical Chest wall movement, Good air movement bilaterally, CTAB RRR,No Gallops,Rubs or new Murmurs, No Parasternal Heave +ve B.Sounds, Abd Soft, No tenderness, No organomegaly appriciated, No rebound - guarding or rigidity. No Cyanosis, Clubbing or edema, No new Rash or bruise  Right upper quadrant gallbladder drain  Data Review:   Micro Results Recent Results (from the past 240 hour(s))  MRSA PCR Screening     Status: None   Collection Time: 04/14/15 10:56 PM  Result Value Ref Range Status   MRSA by PCR NEGATIVE NEGATIVE Final    Comment:        The GeneXpert MRSA Assay (FDA approved for NASAL specimens only), is one component of a comprehensive MRSA colonization surveillance program. It is not intended to diagnose MRSA infection nor to guide or monitor treatment for MRSA infections.   Urine culture     Status: None   Collection Time: 04/15/15 12:48 AM  Result Value Ref Range Status   Specimen Description URINE, CLEAN CATCH  Final   Special Requests NONE  Final   Culture MULTIPLE SPECIES PRESENT, SUGGEST RECOLLECTION  Final   Report Status 04/16/2015 FINAL  Final  Culture, blood (routine x 2)     Status: None   Collection Time: 04/15/15  1:26 AM  Result Value Ref Range Status   Specimen Description BLOOD RIGHT ANTECUBITAL  Final   Special Requests BOTTLES DRAWN AEROBIC AND ANAEROBIC 10CC EACH  Final   Culture NO GROWTH 5 DAYS  Final   Report Status 04/20/2015 FINAL  Final  Culture, blood (routine x 2)     Status: None   Collection Time: 04/15/15  1:37 AM  Result Value Ref Range Status   Specimen Description  BLOOD RIGHT FOREARM  Final   Special Requests BOTTLES DRAWN AEROBIC ONLY [place]  Final   Culture NO GROWTH 5 DAYS  Final   Report Status 04/20/2015 FINAL  Final  Clostridium Difficile by PCR (not at [facility])     Status: None   Collection Time: 04/16/15 12:14 PM  Result Value Ref Range Status   C difficile by pcr NEGATIVE NEGATIVE Final    Radiology Reports Mr Abdomen Mrcp Wo Cm  04/20/2015   CLINICAL DATA:  69 year old female with history of jaundice. 3-4 day history of shortness of breath with minimal exertion, progressively worsening. Multiple recent falls. Blood in the stool.  EXAM: MRI ABDOMEN WITHOUT CONTRAST  (INCLUDING MRCP)  TECHNIQUE: Multiplanar multisequence MR imaging of the abdomen was performed. Heavily T2-weighted images of the biliary and pancreatic ducts were obtained, and three-dimensional MRCP images were rendered by post processing.  COMPARISON:  None.  FINDINGS: Comment: Today's study is limited for assessment of visceral and/or vascular lesions by lack of IV gadolinium. In addition, today's study is limited by considerable patient respiratory motion and respiratory variation.  Lower chest: Signal intensity in the dependent portion of the right lower lobe, concerning for area of aspiration pneumonitis or pneumonia.  Hepatobiliary: Widespread heterogeneous increased T2 signal intensity throughout the hepatic parenchyma, concerning for multifocal metastases. MRCP images are exceedingly limited by patient motion, however, no significant intrahepatic biliary ductal dilatation is appreciated at this time. A percutaneous biliary drainage catheter is noted, but poorly delineated on today's MRI examination. Large filling defect in the lumen of the gallbladder presumably reflects 1 or more large gallstones. In the region of the hepatic hilum there is a very poorly defined mass-like area which is best demonstrated on diffusion images where this lesion is high signal intensity (subsequently low  signal intensity on the accompanying ADC map), concerning for large neoplasm. This is estimated to measure approximately 5.1 x 3.9 cm in size (image 22 of series 14). The inferior aspect of this mass is intimately associated with the superior aspect of the pancreatic head, and the  adjacent first/second portion of the duodenum. Common bile duct is poorly demonstrated, but does not appear dilated at this time. Diffuse increased T2 signal intensity adjacent to the portal triads, indicative of periportal edema.  Pancreas: As discussed above, the large mass-like area centered in the porta hepatis encroaches upon the superior aspect of the pancreatic head. Whether not this mass arises from the pancreatic head is uncertain on today's examination. Body and tail of the pancreas are normal in appearance on today's noncontrast MRI examination. Specifically, no ductal dilatation.  Spleen: Unremarkable.  Adrenals/Urinary Tract: Bilateral adrenal glands and bilateral kidneys are normal in appearance.  Stomach/Bowel: As mentioned above, the large mass-like area centered in the porta hepatis is intimately associated with the first/second portion of the duodenum. Remaining portions of visualized small bowel and colon are unremarkable.  Vascular/Lymphatic: No definite aneurysm identified in the visualized abdominal vasculature. Flow void is noted within the superior mesenteric vein, splenic vein, portal vein and main portal vein branches bilaterally. Prominent soft tissue in the porta hepatis may include some nodal tissue. No other lymphadenopathy is noted elsewhere in the abdomen on today's noncontrast MRI examination.  Other: Trace volume of ascites.  Musculoskeletal: No aggressive osseous lesion identified in the visualized portions of the skeleton on today's noncontrast MRI examination.  IMPRESSION: 1. Very limited study secondary to respiratory motion and lack of IV contrast. With these limitations in mind, there is a large mass  centered in the region of the porta hepatis, which likely accounts for the recently discovered biliary tract obstruction. No residual intrahepatic biliary ductal dilatation following placement of percutaneous drainage catheter. There are innumerable hepatic lesions, suggesting widespread metastatic disease to the liver. The mass in the porta hepatis is intimately associated with the superior aspect of the pancreatic head, as well as the first/second portion of the duodenum, which could indicate either primary hepatic, biliary, pancreatic or duodenal lesion (favored to be primary hepatobiliary). 2. Cholelithiasis. 3. Small volume of ascites.   Electronically Signed   By: Vinnie Langton M.D.   On: 04/20/2015 08:00   Dg Chest Port 1 View  04/15/2015   CLINICAL DATA:  Acute onset of generalized shortness of breath and weakness. Initial encounter.  EXAM: PORTABLE CHEST - 1 VIEW  COMPARISON:  None.  FINDINGS: The lungs are well-aerated. Minimal right basilar density is thought to reflect overlying soft tissues. There is no evidence of focal opacification, pleural effusion or pneumothorax.  The cardiomediastinal silhouette is within normal limits. No acute osseous abnormalities are seen.  IMPRESSION: No acute cardiopulmonary process seen.   Electronically Signed   By: Garald Balding M.D.   On: 04/15/2015 01:51   Ir Biliary Drain Placement With Cholangiogram  04/16/2015   INDICATION: High-grade biliary obstruction with bilirubin of 42 and endoscopy demonstrating 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 ERCP directed biliary stent placement was not possible and the patient now presents for percutaneous biliary drainage.  EXAM: ULTRASOUND AND FLUOROSCOPIC GUIDED PERCUTANEOUS TRANSHEPATIC CHOLANGIOGRAM AND BILIARY DRAINAGE CATHETER PLACEMENT  COMPARISON:  None.  MEDICATIONS: 2.0 mg IV Versed sec, 100 mcg IV fentanyl. A scheduled dose of 2.25 g IV Zosyn as well as 400 mg IV Cipro were administered.   CONTRAST:  30 mL OMNIPAQUE IOHEXOL 300 MG/ML  SOLN  ANESTHESIA/SEDATION: Total Moderate Sedation Time  30 minutes  FLUOROSCOPY TIME:  4 minutes and 49 seconds.  COMPLICATIONS: None  TECHNIQUE: Informed written consent was obtained from [facility] after a discussion of the risks, benefits and  alternatives to treatment. Questions regarding the procedure were encouraged and answered. A timeout was performed prior to the initiation of the procedure.  The right upper abdominal quadrant was prepped and draped in the usual sterile fashion, and a sterile drape was applied covering the operative field. Maximum barrier sterile technique with sterile gowns and gloves were used for the procedure.  Ultrasound scanning of the right upper abdominal quadrant was performed to delineate the anatomy and avoid transgression of the gallbladder or pleura. Local anesthesia was provided with 1% lidocaine. Under direct ultrasound guidance, a 21 gauge needle was advanced into a right lobe intrahepatic duct. Contrast injection was performed and cholangiography performed of the intrahepatic biliary tree.  A guidewire was advanced through the needle and an Accustick dilator advanced into the central bile ducts. The Accustick catheter was exchanged for a Kumpe catheter over a [employer] wire. With the use of a regular glide wire, the Kumpe catheter was advanced beyond the level of the common bile duct obstruction and into the duodenum.  Over an Amplatz stiff wire, the tract was dilated and a 10.2 [place] biliary drainage catheter was advanced with coil ultimately locked within the duodenum. Contrast was injected and a completion radiograph was obtained. The catheter was connected to a drainage bag which yielded the brisk return of clear bile. The catheter was secured to the skin with an interrupted suture and StatLock device. The patient tolerated the procedure well without immediate postprocedural complication.  FINDINGS: Cholangiography  demonstrates a high-grade biliary obstruction with massively dilated intrahepatic and central bile ducts present. There was beaking noted at the level of the common bile duct with significant obstruction present over the distal segment of the CBD. Based on appearance, there may be more than just an ampullary process causing biliary stricture which extends higher than expected for ampullary involvement alone.  After crossing the obstruction, the biliary drainage catheter was able to be formed in the duodenum with sideholes extending up into the central biliary tree. The catheter will be left to gravity drainage to allow bilirubin to decrease. Ultimately, after pending tissue biopsy results from endoscopic biopsies, the common bile duct stricture may be able to be treated with an internalized permanent biliary stent from current percutaneous access.  IMPRESSION: Cholangiogram confirms the presence of a high-grade biliary obstruction with obstruction at the level of the common bile duct. As above, the stricture of the common bile duct appears to be fairly long and there may be fairly extensive tumor causing constriction of the common bile duct. 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 However, a pancreatic neoplasm may also be present and additional characterization with MRI may be helpful. A 10 French internal/external biliary drainage catheter was able to be placed percutaneously across the level of biliary obstruction and into the duodenum. This will be left to gravity drainage. The common bile duct stricture may be amenable to internalized permanent biliary stent placement once the biliary tree has been allowed to decompress and pending endoscopic biopsy results.   Electronically Signed   By: Aletta Edouard M.D.   On: 04/16/2015 18:50     CBC  Recent Labs Lab 04/17/15 0234 04/18/15 0442 04/19/15 0509 04/20/15 0517 04/21/15 0546  WBC 21.1* 24.7* 24.1* 25.1* 30.5*  HGB 8.3* 8.3* 7.7* 7.1* 8.8*  HCT  23.6* 24.4* 22.8* 21.2* 26.6*  PLT 244 188 181 180 194  MCV 86.8 87.8 90.5 91.8 93.0  MCH 30.5 29.9 30.6 30.7 30.8  MCHC 35.2 34.0 33.8 33.5 33.1  RDW 16.4* 16.2* 17.2* 17.7* 17.9*    Chemistries   Recent Labs Lab 04/15/15 0126 04/15/15 0750  04/16/15 0127 04/16/15 1414 04/17/15 0234 04/18/15 0442 04/19/15 0509 04/20/15 0517  NA 130* 135  < > 138 140 137 136 136 134*  K 3.2* 3.2*  < > 3.0* 3.4* 2.8* 3.2* 4.1 4.0  CL 102 109  < > 102 106 98* 99* 104 101  CO2 11* 12*  < > 19* 19* 25 25 22  21*  GLUCOSE 99 86  < > 103* 93 149* 112* 103* 101*  BUN 100* 94*  < > 81* 78* 68* 51* 42* 34*  CREATININE 7.44* 6.32*  < > 6.08* 6.03* 5.52* 4.80* 4.36* 4.13*  CALCIUM 7.8* 7.7*  < > 8.0* 8.1* 7.9* 8.0* 8.5* 8.6*  MG 1.2* 1.6*  --   --   --   --   --  1.6* 2.1  AST 114*  --   --  72*  --  63* 32 30 39  ALT 79*  --   --  60*  --  49 35 30 33  ALKPHOS 682*  --   --  542*  --  562* 439* 335* 279*  BILITOT 40.8*  --   --  42.2*  --  38.5* 24.8* 19.8* 19.1*  < > = values in this interval not displayed. ------------------------------------------------------------------------------------------------------------------ estimated creatinine clearance is 11.9 mL/min (by C-G formula based on Cr of 4.13). ------------------------------------------------------------------------------------------------------------------ No results for input(s): HGBA1C in the last 72 hours. ------------------------------------------------------------------------------------------------------------------ No results for input(s): CHOL, HDL, LDLCALC, TRIG, CHOLHDL, LDLDIRECT in the last 72 hours. ------------------------------------------------------------------------------------------------------------------  Recent Labs  04/19/15 1258  TSH 0.398  T3FREE 0.4*   ------------------------------------------------------------------------------------------------------------------ No results for input(s): VITAMINB12, FOLATE,  FERRITIN, TIBC, IRON, RETICCTPCT in the last 72 hours.  Coagulation profile  Recent Labs Lab 04/15/15 0126 04/15/15 1330 04/16/15 0127 04/17/15 0234  INR 5.78* 1.23 1.08 1.12    No results for input(s): DDIMER in the last 72 hours.  Cardiac Enzymes  Recent Labs Lab 04/15/15 0126 04/15/15 0750 04/15/15 1330  TROPONINI 0.10* 0.05* 0.05*   ------------------------------------------------------------------------------------------------------------------ Invalid input(s): POCBNP   Time Spent in minutes  35   Heather Harmon K M.D on 04/21/2015 at 9:34 AM  Between 7am to 7pm - Pager - [phone]  After 7pm go to [email or web address] - password [facility]  [facility] -  Office  [phone]

## 2015-04-22 ENCOUNTER — Other Ambulatory Visit (HOSPITAL_COMMUNITY): Payer: Self-pay | Admitting: Interventional Radiology

## 2015-04-22 DIAGNOSIS — K831 Obstruction of bile duct: Secondary | ICD-10-CM

## 2015-04-22 LAB — CBC
HEMATOCRIT: 23.9 % — AB (ref 36.0–46.0)
Hemoglobin: 7.8 g/dL — ABNORMAL LOW (ref 12.0–15.0)
MCH: 30.5 pg (ref 26.0–34.0)
MCHC: 32.6 g/dL (ref 30.0–36.0)
MCV: 93.4 fL (ref 78.0–100.0)
Platelets: 164 10*3/uL (ref 150–400)
RBC: 2.56 MIL/uL — AB (ref 3.87–5.11)
RDW: 18 % — AB (ref 11.5–15.5)
WBC: 23.3 10*3/uL — ABNORMAL HIGH (ref 4.0–10.5)

## 2015-04-22 MED ORDER — CHOLESTYRAMINE 4 G PO PACK: 4.0000 g | PACK | Freq: Two times a day (BID) | ORAL | Status: AC

## 2015-04-22 MED ORDER — SACCHAROMYCES BOULARDII 250 MG PO CAPS: 250.0000 mg | ORAL_CAPSULE | Freq: Two times a day (BID) | ORAL | Status: DC

## 2015-04-22 MED ORDER — MIDODRINE HCL 5 MG PO TABS: 5.0000 mg | ORAL_TABLET | Freq: Three times a day (TID) | ORAL | Status: DC

## 2015-04-22 MED ORDER — PANTOPRAZOLE SODIUM 40 MG PO TBEC: 40.0000 mg | DELAYED_RELEASE_TABLET | Freq: Every day | ORAL | Status: AC

## 2015-04-22 MED ORDER — FERROUS SULFATE 325 (65 FE) MG PO TABS: 325.0000 mg | ORAL_TABLET | Freq: Every day | ORAL | Status: DC

## 2015-04-22 NOTE — Progress Notes (Signed)
Discharge teaching given to pt including activity, diet, medications, and follow-up appts. Pt verbalized understanding of all discharge instructions. IV access was d/c'd. Vitals are stable. Skin is intact except as charted in most recent assessments. Pt to be escorted out by NT, to be driven home by family. Rodolph Bong, RN 04/22/15

## 2015-04-22 NOTE — Discharge Summary (Signed)
Heather Harmon, is a 69 y.o. female  DOB 10-31-1945  MRN [id number].  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 If declines further full comfort care/hospice. Patient currently wanting to hold off on hospice. Is DNR.   Admission Diagnosis  RENAL FAILURE PNEUMONIA TUMOR gi bleed, anemia, jaundice.   Discharge Diagnosis  RENAL FAILURE PNEUMONIA TUMOR gi bleed, anemia, jaundice.     Active Problems:   Common bile duct obstruction   Intraabdominal mass   Severe sepsis   UTI (lower urinary tract infection)   Acute renal failure   Hypokalemia   Hypomagnesemia   Jaundice   Biliary tract obstruction   Cholangiocarcinoma   Palliative care encounter   Generalized weakness      History reviewed. No pertinent past medical history.  Past Surgical History  Procedure Laterality Date  . Esophagogastroduodenoscopy N/A 04/16/2015    Procedure: ESOPHAGOGASTRODUODENOSCOPY (EGD);  Surgeon: Lafayette Dragon, MD;  Location: [facility] ENDOSCOPY;  Service: Endoscopy;  Laterality: N/A;       HPI  :    69 year old female with no known medical history reports "yellow eyes" for about one month now. About 3-4 days ago she began to have SOB with even minimal exertion which was progressively getting worse. 7/19 late PM she had several episodes of vomiting, and 7/20 she had weakness with multiple falls. She also had several loose stools that were noted to be bloody with clots by family. For these reasons she presented to the ER 7/20. Given her presentation she was sent for a CT scan of the abdomen  and pelvis, the results of which are described above. 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   While in ED she developed sinus tachycardia with rates up to the 160s, which resolved with IV metoprolol. She had an abdominal ultrasound as well, however, those results are unavailable to me at this time. She was transferred to [facility] for further evaluation.   She was admitted by pulmonary critical care, she was also seen by GI, she underwent EGD with CBD mass biopsy along with bili drain placement by IR. She was transferred to hospitalist service under my care on 04/18/2015.  Hospital Course:     1. Severe obstructive jaundice secondary to CBD mass suspicious for cholangiocarcinoma versus poorly differentiated small bowel malignancy. GI and IR on board, EGD has been done with biopsy of the suspicious mass along with CBD drain placement by IR. IR eventually wants to place a drain in the CBD. There was also suspicion for cholecystitis. Has finished her trial of IV Zosyn by GI and pulmonary critical care. 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 Per general surgery she is not a surgical candidate for any surgical procedure. Oncology wants to follow outpatient and they have seen the patient here. Note all treatment is going to be simply palliative.   Overall prognosis appears very poor, palliative care on board, patient wants to pursue palliative chemotherapy but wants to be DO NOT RESUSCITATE, if she declines further she wants to be full palliative care/hospice. We'll get home social work and nursing along with PT. We will follow with oncology and IR outpatient.     2. Leukocytosis. Question reactionary due to #1 above. Doubt any active cholecystitis, has CBD drain, C. Difficile negative, x-ray and UA unremarkable,  blood cultures 2 sets negative. She has been afebrile, Zosyn stopped. Continue to monitor clinically. Remains afebrile.   3. Runs of SVT in ICU. Resolved,  TSH and Echo stable, no resolved, low baseline blood pressures without any symptoms. Placed on midodrine. Has been hydrated adequately.   4. ARF. Renal following. Non-oliguric. Likely will avoid dialysis. Gradually improving renal function.    5. Coagulopathy with GI blood loss. Received vitamin K, 7 units of FFP and 1 unit of packed RBC in ICU, stable INR and H&H now   6. Hypokalemia and hypomagnesemia. Replaced and monitor out patient.      Discharge Condition: guarded   Follow UP  Follow-up Information    Follow up with [name],[name], MD. Schedule an appointment as soon as possible for a visit in 1 week.   Specialty:  Family Medicine   Contact information:   [address] [place] [address] [phone]       Follow up with [facility], MD. Schedule an appointment as soon as possible for a visit in 1 week.   Specialty:  Oncology   Why:  CBD Cancer   Contact information:   [address] [address] [phone]       Follow up with [facility], MD. Schedule an appointment as soon as possible for a visit in 1 week.   Specialty:  Interventional Radiology   Why:  Biliary drain   Contact information:   [address] [place] [address] [phone]       Follow up with [facility].   Why:  Ivy RN PT OT SW Aide. Will call in next 24-48 hours to set up first home visit.   Contact information:   [address] [place] [address] [phone]        Consults obtained - IR, GI, CCS, PCCM, Oncology  Diet and Activity recommendation: See Discharge Instructions below  Discharge Instructions      Discharge Instructions    Discharge instructions    Complete by:  As directed   Follow with Primary MD [name],[name], MD in 7 days   Get CBC, CMP, 2 view  Chest X ray checked  by Primary MD next visit.    Activity: As tolerated with Full fall precautions use  walker/cane & assistance as needed   Disposition Home     Diet: Heart Healthy    For Heart failure patients - Check your Weight same time everyday, if you gain over 2 pounds, or you develop in leg swelling, experience more shortness of breath or chest pain, call your Primary MD immediately. Follow Cardiac Low Salt Diet and 1.5 lit/day fluid restriction.   On your next visit with your primary care physician please Get Medicines reviewed and adjusted.   Please request your Prim.MD to go over all Hospital Tests and Procedure/Radiological results at the follow up, please get all Hospital records sent to your Prim MD by signing hospital release before you go home.   If you experience worsening of your admission symptoms, develop shortness of breath, life threatening emergency, suicidal or homicidal thoughts you must seek medical attention immediately by calling 911 or calling your MD immediately  if symptoms less severe.  You Must read complete instructions/literature along with all the possible adverse reactions/side effects for all the Medicines you take and that have been prescribed to you. Take any new Medicines after you have completely understood and accpet all the possible adverse reactions/side effects.   Do not drive, operating heavy machinery, perform activities at heights, swimming or participation in water activities or provide baby sitting services if your were admitted for syncope or siezures until you have seen by Primary MD or a Neurologist and advised to do so again.  Do not drive when taking Pain medications.    Do not take more than prescribed Pain, Sleep and Anxiety Medications  Special Instructions: If you have smoked or chewed Tobacco  in the last 2 yrs please stop smoking, stop any regular Alcohol  and or any Recreational drug use.  Wear Seat belts while  driving.   Please note  You were cared for by a hospitalist during your hospital stay. If you have any questions about your discharge medications or the care you received while you were in the hospital after you are discharged, you can call the unit and asked to speak with the hospitalist on call if the hospitalist that took care of you is not available. Once you are discharged, your primary care physician will handle any further medical issues. Please note that NO REFILLS for any discharge medications will be authorized once you are discharged, as it is imperative that you return to your primary care physician (or establish a relationship with a primary care physician if you do not have one) for your aftercare needs so that they can reassess your need for medications and monitor your lab values.     Increase activity slowly    Complete by:  As directed              Discharge Medications       Medication List    TAKE these medications        cholestyramine 4 G packet  Commonly known as:  QUESTRAN  Take 1 packet (4 g total) by mouth 2 (two) times daily.     ferrous sulfate 325 (65 FE) MG tablet  Take 1 tablet (325 mg total) by mouth daily with breakfast.     midodrine 5 MG tablet  Commonly known as:  PROAMATINE  Take 1 tablet (5 mg total) by mouth 3 (three) times daily with meals.     pantoprazole 40 MG tablet  Commonly known as:  PROTONIX  Take 1 tablet (40 mg total) by mouth daily at  6 (six) AM.     saccharomyces boulardii 250 MG capsule  Commonly known as:  FLORASTOR  Take 1 capsule (250 mg total) by mouth 2 (two) times daily.        Major procedures and Radiology Reports - PLEASE review detailed and final reports for all details, in brief -     CT abd/pelvis 7/20 > Significant intrahepatic and proximal extrahepatic biliary dilation and questionably obstructing mass lesion at the CBD concerning for cholangiocarcinoma. Contracted gallbladder containing high attenuation  material. Enlarged perihepatic lymph node.  US abdomen 7/20 > Results from [place] not available at this time.  EGD 7/22 (Dr Olevia Perches) >> duodenal mass in peri-ampulary area, biopsied  Biliary drain in place 7/22   Mr Abdomen Mrcp Wo Cm  04/20/2015   CLINICAL DATA:  69 year old female with history of jaundice. 3-4 day history of shortness of breath with minimal exertion, progressively worsening. Multiple recent falls. Blood in the stool.  EXAM: MRI ABDOMEN WITHOUT CONTRAST  (INCLUDING MRCP)  TECHNIQUE: Multiplanar multisequence MR imaging of the abdomen was performed. Heavily T2-weighted images of the biliary and pancreatic ducts were obtained, and three-dimensional MRCP images were rendered by post processing.  COMPARISON:  None.  FINDINGS: Comment: Today's study is limited for assessment of visceral and/or vascular lesions by lack of IV gadolinium. In addition, today's study is limited by considerable patient respiratory motion and respiratory variation.  Lower chest: Signal intensity in the dependent portion of the right lower lobe, concerning for area of aspiration pneumonitis or pneumonia.  Hepatobiliary: Widespread heterogeneous increased T2 signal intensity throughout the hepatic parenchyma, concerning for multifocal metastases. MRCP images are exceedingly limited by patient motion, however, no significant intrahepatic biliary ductal dilatation is appreciated at this time. A percutaneous biliary drainage catheter is noted, but poorly delineated on today's MRI examination. Large filling defect in the lumen of the gallbladder presumably reflects 1 or more large gallstones. In the region of the hepatic hilum there is a very poorly defined mass-like area which is best demonstrated on diffusion images where this lesion is high signal intensity (subsequently low signal intensity on the accompanying ADC map), concerning for large neoplasm. This is estimated to measure approximately 5.1 x 3.9 cm in size  (image 22 of series 14). The inferior aspect of this mass is intimately associated with the superior aspect of the pancreatic head, and the adjacent first/second portion of the duodenum. Common bile duct is poorly demonstrated, but does not appear dilated at this time. Diffuse increased T2 signal intensity adjacent to the portal triads, indicative of periportal edema.  Pancreas: As discussed above, the large mass-like area centered in the porta hepatis encroaches upon the superior aspect of the pancreatic head. Whether not this mass arises from the pancreatic head is uncertain on today's examination. Body and tail of the pancreas are normal in appearance on today's noncontrast MRI examination. Specifically, no ductal dilatation.  Spleen: Unremarkable.  Adrenals/Urinary Tract: Bilateral adrenal glands and bilateral kidneys are normal in appearance.  Stomach/Bowel: As mentioned above, the large mass-like area centered in the porta hepatis is intimately associated with the first/second portion of the duodenum. Remaining portions of visualized small bowel and colon are unremarkable.  Vascular/Lymphatic: No definite aneurysm identified in the visualized abdominal vasculature. Flow void is noted within the superior mesenteric vein, splenic vein, portal vein and main portal vein branches bilaterally. Prominent soft tissue in the porta hepatis may include some nodal tissue. No other lymphadenopathy is noted elsewhere in the abdomen on today's  noncontrast MRI examination.  Other: Trace volume of ascites.  Musculoskeletal: No aggressive osseous lesion identified in the visualized portions of the skeleton on today's noncontrast MRI examination.  IMPRESSION: 1. Very limited study secondary to respiratory motion and lack of IV contrast. With these limitations in mind, there is a large mass centered in the region of the porta hepatis, which likely accounts for the recently discovered biliary tract obstruction. No residual  intrahepatic biliary ductal dilatation following placement of percutaneous drainage catheter. There are innumerable hepatic lesions, suggesting widespread metastatic disease to the liver. The mass in the porta hepatis is intimately associated with the superior aspect of the pancreatic head, as well as the first/second portion of the duodenum, which could indicate either primary hepatic, biliary, pancreatic or duodenal lesion (favored to be primary hepatobiliary). 2. Cholelithiasis. 3. Small volume of ascites.   Electronically Signed   By: Vinnie Langton M.D.   On: 04/20/2015 08:00   Dg Chest Port 1 View  04/15/2015   CLINICAL DATA:  Acute onset of generalized shortness of breath and weakness. Initial encounter.  EXAM: PORTABLE CHEST - 1 VIEW  COMPARISON:  None.  FINDINGS: The lungs are well-aerated. Minimal right basilar density is thought to reflect overlying soft tissues. There is no evidence of focal opacification, pleural effusion or pneumothorax.  The cardiomediastinal silhouette is within normal limits. No acute osseous abnormalities are seen.  IMPRESSION: No acute cardiopulmonary process seen.   Electronically Signed   By: Garald Balding M.D.   On: 04/15/2015 01:51   Ir Biliary Drain Placement With Cholangiogram  04/16/2015   INDICATION: High-grade biliary obstruction with bilirubin of 42 and endoscopy demonstrating 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 ERCP directed biliary stent placement was not possible and the patient now presents for percutaneous biliary drainage.  EXAM: ULTRASOUND AND FLUOROSCOPIC GUIDED PERCUTANEOUS TRANSHEPATIC CHOLANGIOGRAM AND BILIARY DRAINAGE CATHETER PLACEMENT  COMPARISON:  None.  MEDICATIONS: 2.0 mg IV Versed sec, 100 mcg IV fentanyl. A scheduled dose of 2.25 g IV Zosyn as well as 400 mg IV Cipro were administered.  CONTRAST:  30 mL OMNIPAQUE IOHEXOL 300 MG/ML  SOLN  ANESTHESIA/SEDATION: Total Moderate Sedation Time  30 minutes  FLUOROSCOPY TIME:   4 minutes and 49 seconds.  COMPLICATIONS: None  TECHNIQUE: Informed written consent was obtained from [facility], Beatric A after a discussion of the risks, benefits and alternatives to treatment. Questions regarding the procedure were encouraged and answered. A timeout was performed prior to the initiation of the procedure.  The right upper abdominal quadrant was prepped and draped in the usual sterile fashion, and a sterile drape was applied covering the operative field. Maximum barrier sterile technique with sterile gowns and gloves were used for the procedure.  Ultrasound scanning of the right upper abdominal quadrant was performed to delineate the anatomy and avoid transgression of the gallbladder or pleura. Local anesthesia was provided with 1% lidocaine. Under direct ultrasound guidance, a 21 gauge needle was advanced into a right lobe intrahepatic duct. Contrast injection was performed and cholangiography performed of the intrahepatic biliary tree.  A guidewire was advanced through the needle and an Accustick dilator advanced into the central bile ducts. The Accustick catheter was exchanged for a Kumpe catheter over a [employer] wire. With the use of a regular glide wire, the Kumpe catheter was advanced beyond the level of the common bile duct obstruction and into the duodenum.  Over an Amplatz stiff wire, the tract was dilated and a 10.2 [place] biliary drainage catheter  was advanced with coil ultimately locked within the duodenum. Contrast was injected and a completion radiograph was obtained. The catheter was connected to a drainage bag which yielded the brisk return of clear bile. The catheter was secured to the skin with an interrupted suture and StatLock device. The patient tolerated the procedure well without immediate postprocedural complication.  FINDINGS: Cholangiography demonstrates a high-grade biliary obstruction with massively dilated intrahepatic and central bile ducts present. There was beaking noted at  the level of the common bile duct with significant obstruction present over the distal segment of the CBD. Based on appearance, there may be more than just an ampullary process causing biliary stricture which extends higher than expected for ampullary involvement alone.  After crossing the obstruction, the biliary drainage catheter was able to be formed in the duodenum with sideholes extending up into the central biliary tree. The catheter will be left to gravity drainage to allow bilirubin to decrease. Ultimately, after pending tissue biopsy results from endoscopic biopsies, the common bile duct stricture may be able to be treated with an internalized permanent biliary stent from current percutaneous access.  IMPRESSION: Cholangiogram confirms the presence of a high-grade biliary obstruction with obstruction at the level of the common bile duct. As above, the stricture of the common bile duct appears to be fairly long and there may be fairly extensive tumor causing constriction of the common bile duct. 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 However, a pancreatic neoplasm may also be present and additional characterization with MRI may be helpful. A 10 French internal/external biliary drainage catheter was able to be placed percutaneously across the level of biliary obstruction and into the duodenum. This will be left to gravity drainage. The common bile duct stricture may be amenable to internalized permanent biliary stent placement once the biliary tree has been allowed to decompress and pending endoscopic biopsy results.   Electronically Signed   By: Aletta Edouard M.D.   On: 04/16/2015 18:50    Micro Results      Recent Results (from the past 240 hour(s))  MRSA PCR Screening     Status: None   Collection Time: 04/14/15 10:56 PM  Result Value Ref Range Status   MRSA by PCR NEGATIVE NEGATIVE Final    Comment:        The GeneXpert MRSA Assay (FDA approved for NASAL specimens only), is one  component of a comprehensive MRSA colonization surveillance program. It is not intended to diagnose MRSA infection nor to guide or monitor treatment for MRSA infections.   Urine culture     Status: None   Collection Time: 04/15/15 12:48 AM  Result Value Ref Range Status   Specimen Description URINE, CLEAN CATCH  Final   Special Requests NONE  Final   Culture MULTIPLE SPECIES PRESENT, SUGGEST RECOLLECTION  Final   Report Status 04/16/2015 FINAL  Final  Culture, blood (routine x 2)     Status: None   Collection Time: 04/15/15  1:26 AM  Result Value Ref Range Status   Specimen Description BLOOD RIGHT ANTECUBITAL  Final   Special Requests BOTTLES DRAWN AEROBIC AND ANAEROBIC 10CC EACH  Final   Culture NO GROWTH 5 DAYS  Final   Report Status 04/20/2015 FINAL  Final  Culture, blood (routine x 2)     Status: None   Collection Time: 04/15/15  1:37 AM  Result Value Ref Range Status   Specimen Description BLOOD RIGHT FOREARM  Final   Special Requests BOTTLES DRAWN AEROBIC ONLY  [facility]  Final   Culture NO GROWTH 5 DAYS  Final   Report Status 04/20/2015 FINAL  Final  Clostridium Difficile by PCR (not at [facility])     Status: None   Collection Time: 04/16/15 12:14 PM  Result Value Ref Range Status   C difficile by pcr NEGATIVE NEGATIVE Final       Today   Subjective    Heather Harmon today has no headache,no chest abdominal pain,no new weakness tingling or numbness, feels much better wants to go home today.     Objective   Blood pressure 89/52, pulse 92, temperature 97.5 F (36.4 C), temperature source Oral, resp. rate 20, height 5\' 5"  (1.651 m), weight 62.5 kg (137 lb 12.6 oz), SpO2 98 %.   Intake/Output Summary (Last 24 hours) at 04/22/15 0859 Last data filed at 04/22/15 0032  Gross per 24 hour  Intake    340 ml  Output   1130 ml  Net   -790 ml    Exam Awake Alert, Oriented x 3, No new F.N deficits, Normal affect [place].AT,PERRAL, ++ Icterus Supple Neck,No JVD, No cervical  lymphadenopathy appriciated.  Symmetrical Chest wall movement, Good air movement bilaterally, CTAB RRR,No Gallops,Rubs or new Murmurs, No Parasternal Heave +ve B.Sounds, Abd Soft, Non tender, No organomegaly appriciated, No rebound -guarding or rigidity. RUQ Drain in place No Cyanosis, Clubbing or edema, No new Rash or bruise   Data Review   CBC w Diff: Lab Results  Component Value Date   WBC 23.3* 04/22/2015   HGB 7.8* 04/22/2015   HCT 23.9* 04/22/2015   PLT 164 04/22/2015    CMP: Lab Results  Component Value Date   NA 134* 04/20/2015   K 4.0 04/20/2015   CL 101 04/20/2015   CO2 21* 04/20/2015   BUN 34* 04/20/2015   CREATININE 4.13* 04/20/2015   PROT 5.2* 04/20/2015   ALBUMIN 1.6* 04/20/2015   BILITOT 19.1* 04/20/2015   ALKPHOS 279* 04/20/2015   AST 39 04/20/2015   ALT 33 04/20/2015  .   Total Time in preparing paper work, data evaluation and todays exam - 35 minutes  Thurnell Lose M.D on 04/22/2015 at [place]  [phone]

## 2015-04-25 ENCOUNTER — Inpatient Hospital Stay (HOSPITAL_COMMUNITY)
Admission: EM | Admit: 2015-04-25 | Discharge: 2015-04-27 | DRG: 641 | Disposition: A | Discharge status: Hospice | Payer: Commercial Managed Care - HMO | Attending: Internal Medicine | Admitting: Internal Medicine

## 2015-04-25 ENCOUNTER — Encounter (HOSPITAL_COMMUNITY): Payer: Self-pay | Admitting: Emergency Medicine

## 2015-04-25 DIAGNOSIS — E871 Hypo-osmolality and hyponatremia: Secondary | ICD-10-CM | POA: Diagnosis present

## 2015-04-25 DIAGNOSIS — R627 Adult failure to thrive: Principal | ICD-10-CM | POA: Diagnosis present

## 2015-04-25 DIAGNOSIS — Z79899 Other long term (current) drug therapy: Secondary | ICD-10-CM

## 2015-04-25 DIAGNOSIS — D638 Anemia in other chronic diseases classified elsewhere: Secondary | ICD-10-CM | POA: Diagnosis present

## 2015-04-25 DIAGNOSIS — D649 Anemia, unspecified: Secondary | ICD-10-CM | POA: Diagnosis present

## 2015-04-25 DIAGNOSIS — R17 Unspecified jaundice: Secondary | ICD-10-CM | POA: Insufficient documentation

## 2015-04-25 DIAGNOSIS — E860 Dehydration: Secondary | ICD-10-CM | POA: Diagnosis present

## 2015-04-25 DIAGNOSIS — Z87891 Personal history of nicotine dependence: Secondary | ICD-10-CM

## 2015-04-25 DIAGNOSIS — E872 Acidosis: Secondary | ICD-10-CM | POA: Diagnosis present

## 2015-04-25 DIAGNOSIS — D62 Acute posthemorrhagic anemia: Secondary | ICD-10-CM | POA: Diagnosis not present

## 2015-04-25 DIAGNOSIS — N179 Acute kidney failure, unspecified: Secondary | ICD-10-CM | POA: Diagnosis present

## 2015-04-25 DIAGNOSIS — K831 Obstruction of bile duct: Secondary | ICD-10-CM | POA: Diagnosis present

## 2015-04-25 DIAGNOSIS — R4182 Altered mental status, unspecified: Secondary | ICD-10-CM

## 2015-04-25 DIAGNOSIS — Z515 Encounter for palliative care: Secondary | ICD-10-CM

## 2015-04-25 DIAGNOSIS — C221 Intrahepatic bile duct carcinoma: Secondary | ICD-10-CM | POA: Diagnosis present

## 2015-04-25 DIAGNOSIS — Z66 Do not resuscitate: Secondary | ICD-10-CM | POA: Diagnosis present

## 2015-04-25 DIAGNOSIS — K921 Melena: Secondary | ICD-10-CM | POA: Diagnosis present

## 2015-04-25 DIAGNOSIS — Z6821 Body mass index (BMI) 21.0-21.9, adult: Secondary | ICD-10-CM

## 2015-04-25 DIAGNOSIS — E46 Unspecified protein-calorie malnutrition: Secondary | ICD-10-CM | POA: Diagnosis present

## 2015-04-25 HISTORY — DX: Malignant (primary) neoplasm, unspecified: C80.1

## 2015-04-25 LAB — I-STAT CHEM 8, ED
BUN: 60 mg/dL — AB (ref 6–20)
CREATININE: 4.9 mg/dL — AB (ref 0.44–1.00)
Calcium, Ion: 1.11 mmol/L — ABNORMAL LOW (ref 1.13–1.30)
Chloride: 109 mmol/L (ref 101–111)
Glucose, Bld: 107 mg/dL — ABNORMAL HIGH (ref 65–99)
HCT: 32 % — ABNORMAL LOW (ref 36.0–46.0)
Hemoglobin: 10.9 g/dL — ABNORMAL LOW (ref 12.0–15.0)
Potassium: 6.6 mmol/L (ref 3.5–5.1)
SODIUM: 126 mmol/L — AB (ref 135–145)
TCO2: 10 mmol/L (ref 0–100)

## 2015-04-25 LAB — I-STAT CG4 LACTIC ACID, ED: Lactic Acid, Venous: 1.77 mmol/L (ref 0.5–2.0)

## 2015-04-25 MED ORDER — SODIUM CHLORIDE 0.9 % IV BOLUS (SEPSIS)
1000.0000 mL | Freq: Once | INTRAVENOUS | Status: AC
  Administered 2015-04-26: 1000 mL via INTRAVENOUS

## 2015-04-25 NOTE — ED Notes (Signed)
Per [facility], pt came from home w/ family after having just being discharged from [facility] on Thursday.  Pt has deteratered w/in the past 2 days, not eating/drinking much, not producing any urine in the past day, most of her activity level had stopped.  She does have a drain from her liver and hx of cancer and renal failure.  Vitals: temp 100.8, CBG 101, sin tac 110-120, 98% on room air, 108/78.  20g IV in right forarm, was given 329ml in route.

## 2015-04-25 NOTE — ED Provider Notes (Signed)
CSN: [id number]     Arrival date & time 04/25/15  2332 History  This chart was scribed for Debby Freiberg, MD by Hansel Feinstein, ED Scribe. This patient was seen in room A12C/A12C and the patient's care was started at 11:47 PM.     Chief Complaint  Patient presents with  . Altered Mental Status   The history is provided by the EMS personnel and the nursing home. The history is limited by the condition of the patient. No language interpreter was used.   LEVEL 5 CAVEAT: HPI and ROS limited due to pt's condition. Pt is alert but not oriented to time or place.    HPI Comments: Heather Harmon is a 69 y.o. female who presents to the Emergency Department complaining of altered mental status onset 2 days ago. Per nursing, the pt has had no urine output today, has not been moving around or eating for 2 days and her last CBG was 101. Was able to tell the EMS her name and nothing else. 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 The family states that she has been deteriorating since being discharged from the EGD.   Past Medical History  Diagnosis Date  . Cancer    Past Surgical History  Procedure Laterality Date  . Esophagogastroduodenoscopy N/A 04/16/2015    Procedure: ESOPHAGOGASTRODUODENOSCOPY (EGD);  Surgeon: Lafayette Dragon, MD;  Location: [facility] ENDOSCOPY;  Service: Endoscopy;  Laterality: N/A;   History reviewed. No pertinent family history. History  Substance Use Topics  . Smoking status: Former Research scientist (life sciences)  . Smokeless tobacco: Not on file  . Alcohol Use: No   OB History    No data available     Review of Systems  Unable to perform ROS Genitourinary: Positive for decreased urine volume.  Psychiatric/Behavioral: Positive for confusion.    Allergies  Review of patient's allergies indicates no known allergies.  Home Medications   Prior to Admission medications   Medication Sig Start Date End Date Taking? Authorizing Provider   cholestyramine (QUESTRAN) 4 G packet Take 1 packet (4 g total) by mouth 2 (two) times daily. 04/22/15  Yes Thurnell Lose, MD  ferrous sulfate 325 (65 FE) MG tablet Take 1 tablet (325 mg total) by mouth daily with breakfast. 04/22/15  Yes Thurnell Lose, MD  midodrine (PROAMATINE) 5 MG tablet Take 1 tablet (5 mg total) by mouth 3 (three) times daily with meals. 04/22/15  Yes Thurnell Lose, MD  pantoprazole (PROTONIX) 40 MG tablet Take 1 tablet (40 mg total) by mouth daily at 6 (six) AM. 04/22/15  Yes Thurnell Lose, MD  saccharomyces boulardii (FLORASTOR) 250 MG capsule Take 1 capsule (250 mg total) by mouth 2 (two) times daily. 04/22/15  Yes Thurnell Lose, MD   BP 123/59 mmHg  Pulse 65  Temp(Src) 97.8 F (36.6 C) (Oral)  Resp 21  SpO2 100% Physical Exam  Constitutional: She appears well-developed and well-nourished.  HENT:  Head: Normocephalic and atraumatic.  Right Ear: External ear normal.  Left Ear: External ear normal.  Eyes: Conjunctivae and EOM are normal. Pupils are equal, round, and reactive to light.  Neck: Normal range of motion. Neck supple.  Cardiovascular: Normal rate, regular rhythm, normal heart sounds and intact distal pulses.   Pulmonary/Chest: Effort normal and breath sounds normal.  Abdominal: Soft. Bowel sounds are normal. There is no tenderness.  Musculoskeletal: Normal range of motion.  Neurological: She is alert. GCS eye subscore is 4.  GCS verbal subscore is 4. GCS motor subscore is 6.  No focal neuro deficits in context of exam limited by confusion  Skin: Skin is warm and dry.  Vitals reviewed.   ED Course  Procedures (including critical care time) DIAGNOSTIC STUDIES: Oxygen Saturation is 98% on RA, normal by my interpretation.    COORDINATION OF CARE: 11:52 PM Discussed treatment plan with pt at bedside and pt agreed to plan.   Labs Review Labs Reviewed  CBC WITH DIFFERENTIAL/PLATELET - Abnormal; Notable for the following:    WBC 14.9 (*)     RBC 2.99 (*)    Hemoglobin 8.8 (*)    HCT 26.9 (*)    RDW 17.5 (*)    Neutrophils Relative % 79 (*)    Neutro Abs 11.8 (*)    Monocytes Absolute 1.2 (*)    All other components within normal limits  COMPREHENSIVE METABOLIC PANEL - Abnormal; Notable for the following:    Sodium 126 (*)    CO2 12 (*)    Glucose, Bld 118 (*)    BUN 44 (*)    Creatinine, Ser 4.62 (*)    Calcium 8.8 (*)    Total Protein 5.8 (*)    Albumin 1.6 (*)    AST 59 (*)    Alkaline Phosphatase 238 (*)    Total Bilirubin 17.5 (*)    GFR calc non Af Amer 9 (*)    GFR calc Af Amer 10 (*)    All other components within normal limits  URINALYSIS, ROUTINE W REFLEX MICROSCOPIC (NOT AT [facility]) - Abnormal; Notable for the following:    Color, Urine AMBER (*)    APPearance CLOUDY (*)    Bilirubin Urine LARGE (*)    Ketones, ur 15 (*)    Protein, ur 30 (*)    Leukocytes, UA SMALL (*)    All other components within normal limits  URINE MICROSCOPIC-ADD ON - Abnormal; Notable for the following:    Bacteria, UA FEW (*)    All other components within normal limits  I-STAT CHEM 8, ED - Abnormal; Notable for the following:    Sodium 126 (*)    Potassium 6.6 (*)    BUN 60 (*)    Creatinine, Ser 4.90 (*)    Glucose, Bld 107 (*)    Calcium, Ion 1.11 (*)    Hemoglobin 10.9 (*)    HCT 32.0 (*)    All other components within normal limits  I-STAT TROPOININ, ED - Abnormal; Notable for the following:    Troponin i, poc 0.48 (*)    All other components within normal limits  POC OCCULT BLOOD, ED - Abnormal; Notable for the following:    Fecal Occult Bld POSITIVE (*)    All other components within normal limits  URINE CULTURE  CULTURE, BLOOD (ROUTINE X 2)  AMMONIA  I-STAT CG4 LACTIC ACID, ED  CBG MONITORING, ED  I-STAT CG4 LACTIC ACID, ED  I-STAT VENOUS BLOOD GAS, ED  I-STAT CG4 LACTIC ACID, ED    Imaging Review Dg Chest 2 View  04/26/2015   CLINICAL DATA:  Altered mental status.  EXAM: CHEST  2 VIEW  COMPARISON:   04/15/2015  FINDINGS: There is mild unchanged right hemidiaphragm elevation. There is mild interstitial coarsening which may be chronic. There is no airspace consolidation. There is no effusion. Hilar and mediastinal contours are unremarkable unchanged. There is mild unchanged aortic tortuosity.  IMPRESSION: No active cardiopulmonary disease.   Electronically Signed   By:  Andreas Newport M.D.   On: 04/26/2015 01:15   Ct Head Wo Contrast  04/26/2015   CLINICAL DATA:  Altered mental status.  Two days.  EXAM: CT HEAD WITHOUT CONTRAST  TECHNIQUE: Contiguous axial images were obtained from the base of the skull through the vertex without intravenous contrast.  COMPARISON:  None.  FINDINGS: There is no intracranial hemorrhage, mass or evidence of acute infarction. There is mild generalized atrophy. There is mild chronic microvascular ischemic change. There is no significant extra-axial fluid collection.  No acute intracranial findings are evident. No bony abnormality is evident. The visible paranasal sinuses are remarkable only for mild membrane thickening in the right maxillary sinus, incompletely imaged.  IMPRESSION: Mild generalized atrophy and chronic microvascular changes. No acute findings.   Electronically Signed   By: Andreas Newport M.D.   On: 04/26/2015 01:05     EKG Interpretation None       CRITICAL CARE Performed by: Debby Freiberg   Total critical care time: 35 min  Critical care time was exclusive of separately billable procedures and treating other patients.  Critical care was necessary to treat or prevent imminent or life-threatening deterioration.  Critical care was time spent personally by me on the following activities: development of treatment plan with patient and/or surrogate as well as nursing, discussions with consultants, evaluation of patient's response to treatment, examination of patient, obtaining history from patient or surrogate, ordering and performing treatments  and interventions, ordering and review of laboratory studies, ordering and review of radiographic studies, pulse oximetry and re-evaluation of patient's condition.  MDM   Final diagnoses:  Altered mental status, unspecified altered mental status type  Failure to thrive in adult    69 y.o. female with pertinent PMH of likely cholangiocarcinoma with ongoing wu presents with altered mental status as above 2 days. Per family, the patient was discharged the hospital and had a gradual decrease in functional status, more prominent beginning at 11 AM on day of visit. No fevers or GI symptoms at home. Patient has been incontinent of urine and generally confused over the last 2 days. On arrival today vitals signs and physical exam as above. The patient has no focal neurodeficits within the context of very limited exam and she is only intermittently following commands. Her workup revealed a number of abnormalities including a positive troponin, a metabolic acidosis, and overall poor prognostic indicator. I had a detailed discussion with the family at length about the patient's prognosis, and they are interested in pursuing hospice care. They're not ready to go home with hospice at this time. Consulted medicine for admission after a detailed discussion confirming DO NOT RESUSCITATE, DO NOT INTUBATE status, as well as not using pressor medication..    I have reviewed all laboratory and imaging studies if ordered as above  1. Altered mental status, unspecified altered mental status type   2. Failure to thrive in adult          Debby Freiberg, MD 04/26/15 [phone]

## 2015-04-26 ENCOUNTER — Emergency Department (HOSPITAL_COMMUNITY): Payer: Commercial Managed Care - HMO

## 2015-04-26 ENCOUNTER — Encounter (HOSPITAL_COMMUNITY): Payer: Self-pay | Admitting: Internal Medicine

## 2015-04-26 DIAGNOSIS — N179 Acute kidney failure, unspecified: Secondary | ICD-10-CM

## 2015-04-26 DIAGNOSIS — Z6821 Body mass index (BMI) 21.0-21.9, adult: Secondary | ICD-10-CM | POA: Diagnosis not present

## 2015-04-26 DIAGNOSIS — R627 Adult failure to thrive: Principal | ICD-10-CM

## 2015-04-26 DIAGNOSIS — D638 Anemia in other chronic diseases classified elsewhere: Secondary | ICD-10-CM | POA: Diagnosis present

## 2015-04-26 DIAGNOSIS — D62 Acute posthemorrhagic anemia: Secondary | ICD-10-CM | POA: Diagnosis not present

## 2015-04-26 DIAGNOSIS — Z66 Do not resuscitate: Secondary | ICD-10-CM | POA: Diagnosis present

## 2015-04-26 DIAGNOSIS — Z87891 Personal history of nicotine dependence: Secondary | ICD-10-CM | POA: Diagnosis not present

## 2015-04-26 DIAGNOSIS — Z515 Encounter for palliative care: Secondary | ICD-10-CM | POA: Diagnosis not present

## 2015-04-26 DIAGNOSIS — Z79899 Other long term (current) drug therapy: Secondary | ICD-10-CM | POA: Diagnosis not present

## 2015-04-26 DIAGNOSIS — E871 Hypo-osmolality and hyponatremia: Secondary | ICD-10-CM | POA: Diagnosis present

## 2015-04-26 DIAGNOSIS — E860 Dehydration: Secondary | ICD-10-CM | POA: Diagnosis present

## 2015-04-26 DIAGNOSIS — E872 Acidosis: Secondary | ICD-10-CM | POA: Diagnosis present

## 2015-04-26 DIAGNOSIS — C221 Intrahepatic bile duct carcinoma: Secondary | ICD-10-CM | POA: Diagnosis present

## 2015-04-26 DIAGNOSIS — E46 Unspecified protein-calorie malnutrition: Secondary | ICD-10-CM | POA: Diagnosis present

## 2015-04-26 DIAGNOSIS — K831 Obstruction of bile duct: Secondary | ICD-10-CM | POA: Diagnosis not present

## 2015-04-26 DIAGNOSIS — D649 Anemia, unspecified: Secondary | ICD-10-CM | POA: Diagnosis present

## 2015-04-26 DIAGNOSIS — K921 Melena: Secondary | ICD-10-CM | POA: Diagnosis present

## 2015-04-26 DIAGNOSIS — R4182 Altered mental status, unspecified: Secondary | ICD-10-CM | POA: Insufficient documentation

## 2015-04-26 LAB — URINALYSIS, ROUTINE W REFLEX MICROSCOPIC
Glucose, UA: NEGATIVE mg/dL
Hgb urine dipstick: NEGATIVE
Ketones, ur: 15 mg/dL — AB
Nitrite: NEGATIVE
PROTEIN: 30 mg/dL — AB
Specific Gravity, Urine: 1.011 (ref 1.005–1.030)
UROBILINOGEN UA: 0.2 mg/dL (ref 0.0–1.0)
pH: 5 (ref 5.0–8.0)

## 2015-04-26 LAB — CBC WITH DIFFERENTIAL/PLATELET
Basophils Absolute: 0 10*3/uL (ref 0.0–0.1)
Basophils Relative: 0 % (ref 0–1)
Eosinophils Absolute: 0.1 10*3/uL (ref 0.0–0.7)
Eosinophils Relative: 1 % (ref 0–5)
HCT: 26.9 % — ABNORMAL LOW (ref 36.0–46.0)
Hemoglobin: 8.8 g/dL — ABNORMAL LOW (ref 12.0–15.0)
Lymphocytes Relative: 12 % (ref 12–46)
Lymphs Abs: 1.8 10*3/uL (ref 0.7–4.0)
MCH: 29.4 pg (ref 26.0–34.0)
MCHC: 32.7 g/dL (ref 30.0–36.0)
MCV: 90 fL (ref 78.0–100.0)
Monocytes Absolute: 1.2 10*3/uL — ABNORMAL HIGH (ref 0.1–1.0)
Monocytes Relative: 8 % (ref 3–12)
Neutro Abs: 11.8 10*3/uL — ABNORMAL HIGH (ref 1.7–7.7)
Neutrophils Relative %: 79 % — ABNORMAL HIGH (ref 43–77)
Platelets: 170 10*3/uL (ref 150–400)
RBC: 2.99 MIL/uL — ABNORMAL LOW (ref 3.87–5.11)
RDW: 17.5 % — ABNORMAL HIGH (ref 11.5–15.5)
WBC: 14.9 10*3/uL — ABNORMAL HIGH (ref 4.0–10.5)

## 2015-04-26 LAB — I-STAT TROPONIN, ED: Troponin i, poc: 0.48 ng/mL (ref 0.00–0.08)

## 2015-04-26 LAB — COMPREHENSIVE METABOLIC PANEL
ALT: 41 U/L (ref 14–54)
AST: 59 U/L — ABNORMAL HIGH (ref 15–41)
Albumin: 1.6 g/dL — ABNORMAL LOW (ref 3.5–5.0)
Alkaline Phosphatase: 238 U/L — ABNORMAL HIGH (ref 38–126)
Anion gap: 12 (ref 5–15)
BUN: 44 mg/dL — ABNORMAL HIGH (ref 6–20)
CO2: 12 mmol/L — ABNORMAL LOW (ref 22–32)
Calcium: 8.8 mg/dL — ABNORMAL LOW (ref 8.9–10.3)
Chloride: 102 mmol/L (ref 101–111)
Creatinine, Ser: 4.62 mg/dL — ABNORMAL HIGH (ref 0.44–1.00)
GFR calc Af Amer: 10 mL/min — ABNORMAL LOW (ref >60)
GFR calc non Af Amer: 9 mL/min — ABNORMAL LOW (ref >60)
Glucose, Bld: 118 mg/dL — ABNORMAL HIGH (ref 65–99)
Potassium: 5 mmol/L (ref 3.5–5.1)
Sodium: 126 mmol/L — ABNORMAL LOW (ref 135–145)
Total Bilirubin: 17.5 mg/dL — ABNORMAL HIGH (ref 0.3–1.2)
Total Protein: 5.8 g/dL — ABNORMAL LOW (ref 6.5–8.1)

## 2015-04-26 LAB — CBG MONITORING, ED: Glucose-Capillary: 92 mg/dL (ref 65–99)

## 2015-04-26 LAB — URINE MICROSCOPIC-ADD ON

## 2015-04-26 LAB — AMMONIA: AMMONIA: 25 umol/L (ref 9–35)

## 2015-04-26 LAB — POC OCCULT BLOOD, ED: Fecal Occult Bld: POSITIVE — AB

## 2015-04-26 LAB — I-STAT CG4 LACTIC ACID, ED: Lactic Acid, Venous: 1.87 mmol/L (ref 0.5–2.0)

## 2015-04-26 MED ORDER — ACETAMINOPHEN 650 MG RE SUPP: 650.0000 mg | Freq: Four times a day (QID) | RECTAL | Status: DC | PRN

## 2015-04-26 MED ORDER — MIDODRINE HCL 5 MG PO TABS
5.0000 mg | ORAL_TABLET | Freq: Three times a day (TID) | ORAL | Status: DC
Start: 2015-04-26 — End: 2015-04-26
  Administered 2015-04-26 (×2): 5 mg via ORAL
  Filled 2015-04-26 (×3): qty 1

## 2015-04-26 MED ORDER — CHOLESTYRAMINE 4 G PO PACK
4.0000 g | PACK | Freq: Two times a day (BID) | ORAL | Status: DC
  Administered 2015-04-26: 4 g via ORAL
  Filled 2015-04-26 (×4): qty 1

## 2015-04-26 MED ORDER — FERROUS SULFATE 325 (65 FE) MG PO TABS
325.0000 mg | ORAL_TABLET | Freq: Every day | ORAL | Status: DC
  Administered 2015-04-26: 325 mg via ORAL
  Filled 2015-04-26 (×3): qty 1

## 2015-04-26 MED ORDER — SODIUM CHLORIDE 0.9 % IV BOLUS (SEPSIS)
1000.0000 mL | Freq: Once | INTRAVENOUS | Status: AC
  Administered 2015-04-26: 1000 mL via INTRAVENOUS

## 2015-04-26 MED ORDER — MORPHINE SULFATE 2 MG/ML IJ SOLN: 1.0000 mg | INTRAMUSCULAR | Status: DC | PRN

## 2015-04-26 MED ORDER — ONDANSETRON HCL 4 MG PO TABS: 4.0000 mg | ORAL_TABLET | Freq: Four times a day (QID) | ORAL | Status: DC | PRN

## 2015-04-26 MED ORDER — DEXTROSE-NACL 5-0.9 % IV SOLN
INTRAVENOUS | Status: DC
  Administered 2015-04-26 – 2015-04-27 (×2): via INTRAVENOUS

## 2015-04-26 MED ORDER — ACETAMINOPHEN 325 MG PO TABS: 650.0000 mg | ORAL_TABLET | Freq: Four times a day (QID) | ORAL | Status: DC | PRN

## 2015-04-26 MED ORDER — MORPHINE SULFATE 2 MG/ML IJ SOLN
2.0000 mg | INTRAMUSCULAR | Status: DC | PRN
  Administered 2015-04-26: 2 mg via INTRAVENOUS
  Filled 2015-04-26: qty 1

## 2015-04-26 MED ORDER — PANTOPRAZOLE SODIUM 40 MG PO TBEC
40.0000 mg | DELAYED_RELEASE_TABLET | Freq: Every day | ORAL | Status: DC
  Administered 2015-04-26: 40 mg via ORAL
  Filled 2015-04-26 (×2): qty 1

## 2015-04-26 MED ORDER — ONDANSETRON HCL 4 MG/2ML IJ SOLN: 4.0000 mg | Freq: Four times a day (QID) | INTRAMUSCULAR | Status: DC | PRN

## 2015-04-26 MED ORDER — SACCHAROMYCES BOULARDII 250 MG PO CAPS
250.0000 mg | ORAL_CAPSULE | Freq: Two times a day (BID) | ORAL | Status: DC
Start: 2015-04-26 — End: 2015-04-26
  Administered 2015-04-26: 250 mg via ORAL
  Filled 2015-04-26: qty 1

## 2015-04-26 MED ORDER — LORAZEPAM 2 MG/ML IJ SOLN: 1.0000 mg | INTRAMUSCULAR | Status: DC | PRN

## 2015-04-26 NOTE — Progress Notes (Signed)
Nutrition Brief Note  Chart reviewed. Pt now transitioning to comfort care.  No further nutrition interventions warranted at this time.  Please re-consult as needed.   Forever Arechiga A. Christinamarie Tall, RD, LDN, CDE Pager: [phone] After hours Pager: [phone]  

## 2015-04-26 NOTE — Progress Notes (Signed)
Received report on patient from ED nurse, Junie Panning. ED nurse is to complete patient's bolus fluid before transferring.

## 2015-04-26 NOTE — H&P (Signed)
[facility] History and Physical  Heather Harmon CHY:[id number] DOB: 31-Oct-1945 DOA: 04/25/2015  Referring physician: Dr. Colin Rhein. PCP: Marco Collie, MD  Specialists: None.  Chief Complaint: Poor appetite and intake.  History obtained from patient's sister.  HPI: Heather Harmon is a 69 y.o. 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 In the ER patient also was found to have positive stool for occult blood. And labs revealed worsening metabolic acidosis. Patient otherwise is alert awake and follows commands and answers questions. Patient has been admitted for further management. At this time family is aware that patient has poor prognosis and they have requested just comfort measures.   Review of Systems: As presented in the history of presenting illness, rest negative.  Past Medical History  Diagnosis Date  . Cancer    Past Surgical History  Procedure Laterality Date  . Esophagogastroduodenoscopy N/A 04/16/2015    Procedure: ESOPHAGOGASTRODUODENOSCOPY (EGD);  Surgeon: Lafayette Dragon, MD;  Location: [facility] ENDOSCOPY;  Service: Endoscopy;  Laterality: N/A;   Social History:  reports that she has quit smoking. She does not have any smokeless tobacco history on file. She reports that she does not drink alcohol. Her drug history is not on file. Where does patient live home. Can patient participate in ADLs? No.  No Known Allergies  Family History: History reviewed. No pertinent family history.    Prior to Admission medications   Medication Sig Start Date End Date Taking? Authorizing Provider  cholestyramine (QUESTRAN) 4 G packet Take 1 packet (4 g total) by mouth 2 (two) times daily. 04/22/15  Yes Thurnell Lose, MD  ferrous  sulfate 325 (65 FE) MG tablet Take 1 tablet (325 mg total) by mouth daily with breakfast. 04/22/15  Yes Thurnell Lose, MD  midodrine (PROAMATINE) 5 MG tablet Take 1 tablet (5 mg total) by mouth 3 (three) times daily with meals. 04/22/15  Yes Thurnell Lose, MD  pantoprazole (PROTONIX) 40 MG tablet Take 1 tablet (40 mg total) by mouth daily at 6 (six) AM. 04/22/15  Yes Thurnell Lose, MD  saccharomyces boulardii (FLORASTOR) 250 MG capsule Take 1 capsule (250 mg total) by mouth 2 (two) times daily. 04/22/15  Yes Thurnell Lose, MD    Physical Exam: Filed Vitals:   04/26/15 0415 04/26/15 0430 04/26/15 0445 04/26/15 0500  BP: 123/55 113/56 115/60 121/63  Temp:      TempSrc:      Resp: 19 25 20 24   SpO2:         General:  Poorly built and nourished.  Eyes: Icterus positive. No pallor.  ENT: No discharge from the ears eyes nose or mouth.  Neck: No mass felt.  Cardiovascular: S1 and S2 heard.  Respiratory: No rhonchi or crepitations.  Abdomen: Drain seen. Soft nontender.  Skin: No rash.  Musculoskeletal: No edema.  Psychiatric: Alert awake.  Neurologic: Alert awake oriented to her name.  Labs on Admission:  Basic Metabolic Panel:  Recent Labs Lab 04/20/15 0517 04/25/15 2354 04/26/15 0020  NA 134* 126* 126*  K 4.0 6.6* 5.0  CL 101 109 102  CO2 21*  --  12*  GLUCOSE 101* 107* 118*  BUN 34* 60* 44*  CREATININE 4.13* 4.90* 4.62*  CALCIUM  8.6*  --  8.8*  MG 2.1  --   --    Liver Function Tests:  Recent Labs Lab 04/20/15 0517 04/26/15 0020  AST 39 59*  ALT 33 41  ALKPHOS 279* 238*  BILITOT 19.1* 17.5*  PROT 5.2* 5.8*  ALBUMIN 1.6* 1.6*   No results for input(s): LIPASE, AMYLASE in the last 168 hours.  Recent Labs Lab 04/26/15 0345  AMMONIA 25   CBC:  Recent Labs Lab 04/20/15 0517 04/21/15 0546 04/22/15 0512 04/25/15 2354 04/26/15 0020  WBC 25.1* 30.5* 23.3*  --  14.9*  NEUTROABS  --   --   --   --  11.8*  HGB 7.1* 8.8* 7.8* 10.9* 8.8*   HCT 21.2* 26.6* 23.9* 32.0* 26.9*  MCV 91.8 93.0 93.4  --  90.0  PLT 180 194 164  --  170   Cardiac Enzymes: No results for input(s): CKTOTAL, CKMB, CKMBINDEX, TROPONINI in the last 168 hours.  BNP (last 3 results)  Recent Labs  04/15/15 0126  BNP 121.2*    ProBNP (last 3 results) No results for input(s): PROBNP in the last 8760 hours.  CBG:  Recent Labs Lab 04/26/15 0101  GLUCAP 92    Radiological Exams on Admission: Dg Chest 2 View  04/26/2015   CLINICAL DATA:  Altered mental status.  EXAM: CHEST  2 VIEW  COMPARISON:  04/15/2015  FINDINGS: There is mild unchanged right hemidiaphragm elevation. There is mild interstitial coarsening which may be chronic. There is no airspace consolidation. There is no effusion. Hilar and mediastinal contours are unremarkable unchanged. There is mild unchanged aortic tortuosity.  IMPRESSION: No active cardiopulmonary disease.   Electronically Signed   By: Andreas Newport M.D.   On: 04/26/2015 01:15   Ct Head Wo Contrast  04/26/2015   CLINICAL DATA:  Altered mental status.  Two days.  EXAM: CT HEAD WITHOUT CONTRAST  TECHNIQUE: Contiguous axial images were obtained from the base of the skull through the vertex without intravenous contrast.  COMPARISON:  None.  FINDINGS: There is no intracranial hemorrhage, mass or evidence of acute infarction. There is mild generalized atrophy. There is mild chronic microvascular ischemic change. There is no significant extra-axial fluid collection.  No acute intracranial findings are evident. No bony abnormality is evident. The visible paranasal sinuses are remarkable only for mild membrane thickening in the right maxillary sinus, incompletely imaged.  IMPRESSION: Mild generalized atrophy and chronic microvascular changes. No acute findings.   Electronically Signed   By: Andreas Newport M.D.   On: 04/26/2015 01:05     Assessment/Plan Principal Problem:   Failure to thrive in adult Active Problems:   Common  bile duct obstruction   Acute renal failure   Anemia   1. Failure to thrive in adult with recent diagnosis of CBD mass with obstructive jaundice status post drain placement and biopsies showing carcinoma. 2. Renal failure acute with metabolic acidosis and stable creatinine. 3. Anemia with GI bleeding. 4. Elevated troponin. 5. Protein calorie malnutrition.  Plan - at this time after discussing with patient's family patient has been made comfort measures and hospice has been consulted. No further labs will be ordered. Continue with home medications and when necessary morphine for any pain relief. Gentle hydration.   DVT Prophylaxis SCDs.  Code Status: DO NOT RESUSCITATE.  Family Communication: Discussed with patient's sisters were also healthcare power of attorney.  Disposition Plan: Admit to inpatient.    KAKRAKANDY,ARSHAD N. [facility] Pager [phone].  If 7PM-7AM, please  contact night-coverage [email or web address] Password [email or web address] 04/26/2015, 5:18 AM

## 2015-04-26 NOTE — Progress Notes (Signed)
Patient seen and examined. Admitted after midnight secondary to further decline in her condition, failure to thrive and ongoing intermittent episodes of nausea/vomiting. In ED patient found to be dehydrated, with positive metabolic acidosis and acute renal failure. Family understand poor prognosis and has agree to transition to hospice care/ full comfort. See H&P written by Dr. Hal Hope for further info/details on admission  Plan: -full comfort care -will use PRN morphine and ativan -follow rec's from palliative group -symptomatic management  Heather Harmon [phone]

## 2015-04-26 NOTE — ED Notes (Signed)
CBG- 92 

## 2015-04-26 NOTE — Progress Notes (Signed)
Pt HR in 160s. No c/o distress. EKG done. Baltazar Najjar, NP notified. Will continue to monitor pt.

## 2015-04-26 NOTE — Progress Notes (Signed)
Admission Note  Patient arrived to floor in room 5W31 via bed from ED. Patient alert and oriented X 4. Vitals signs  Oral temperature 97.8 F       Blood pressure 123/59       Pulse 65       RR 21       SpO2 100 % on room air. Denies pain. Skin intact, no pressure ulcer noted in sacral area.   Patient's ID armband verified with patient/ family, and in place. Information packet given to patient/ family. Fall risk assessed, SR up X2, patient/ family able to verbalize understanding of risks associated with falls and to call nurse or staff to assist before getting out of bed. Patient/ family oriented to room and equipment. Call bell within reach.

## 2015-04-26 NOTE — Consult Note (Signed)
Consultation Note Date: 04/26/2015   Patient Name: Heather Harmon  DOB: [birth year]  MRN: [id number]  Age / Sex: 69 y.o., female   PCP: Marco Collie, MD Referring Physician: Barton Dubois, MD  Reason for Consultation:GOC  Palliative Care Assessment and Plan Summary of Established Goals of Care and Medical Treatment Preferences    Palliative Care Discussion Held Today:   I met today with Ms. Pultz siblings and other family members. They all acknowledge her decline and that she is dying. They express sadness that she has to go through this and that they didn't even realize she was sick until maybe ~1 month ago. They all confirm that they wish for her to be comfortable at this point. Discussed biopsy and why there was no good treatment options for her cancer. Explained hospice options and family wishes to think about home with hospice vs hospice facility. Emotional support provided. All questions/concerns addressed.    Contacts/Participants in Discussion: Primary Decision Maker: Siblings  Goals of Care/Code Status/Advance Care Planning:   Code Status: DNR  Comfort care   Symptom Management:   Currently no symptoms. Appears to be resting comfortably.   Psycho-social/Spiritual:   Support System: Multiple family members at bedside very supportive.   Desire for further Chaplaincy support: yes  Prognosis: Days  Discharge Planning:  Home with hospice vs hospice facility       Chief Complaint/HPI: 69 y.o. 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 She seems to be more lethargic today to family and goal is for comfort at end of life.   Primary Diagnoses  Present on Admission:  . Failure to thrive in  adult . Acute renal failure . Common bile duct obstruction . Anemia  Palliative Review of Systems:   She denies pain/discomfort.    I have reviewed the medical record, interviewed the patient and family, and examined the patient. The following aspects are pertinent.  Past Medical History  Diagnosis Date  . Cancer    History   Social History  . Marital Status: Single    Spouse Name: N/A  . Number of Children: N/A  . Years of Education: N/A   Occupational History  . 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 Smoking status: Former Research scientist (life sciences)  . Smokeless tobacco: Not on file  . Alcohol Use: No  . Drug Use: Not on file  . Sexual Activity: Not on file   Other Topics Concern  . None   Social History Narrative   History reviewed. No pertinent family history. Scheduled Meds: . cholestyramine  4 g Oral Q12H  . ferrous sulfate  325 mg Oral Q breakfast  . midodrine  5 mg Oral TID WC  . pantoprazole  40 mg Oral Q0600  . saccharomyces boulardii  250 mg Oral BID   Continuous Infusions: . dextrose 5 % and 0.9% NaCl 50 mL/hr at 04/26/15 0608   PRN Meds:.acetaminophen **OR** acetaminophen, morphine injection, ondansetron **OR** ondansetron (ZOFRAN) IV Medications Prior to Admission:  Prior to Admission medications   Medication Sig Start Date End Date Taking? Authorizing Provider  cholestyramine Lucrezia Starch) 4 G packet Take 1  packet (4 g total) by mouth 2 (two) times daily. 04/22/15  Yes Thurnell Lose, MD  ferrous sulfate 325 (65 FE) MG tablet Take 1 tablet (325 mg total) by mouth daily with breakfast. 04/22/15  Yes Thurnell Lose, MD  midodrine (PROAMATINE) 5 MG tablet Take 1 tablet (5 mg total) by mouth 3 (three) times daily with meals. 04/22/15  Yes Thurnell Lose, MD  pantoprazole (PROTONIX) 40 MG tablet Take 1 tablet (40 mg total) by mouth daily at 6 (six) AM. 04/22/15  Yes Thurnell Lose, MD    saccharomyces boulardii (FLORASTOR) 250 MG capsule Take 1 capsule (250 mg total) by mouth 2 (two) times daily. 04/22/15  Yes Thurnell Lose, MD   No Known Allergies CBC:    Component Value Date/Time   WBC 14.9* 04/26/2015 0020   HGB 8.8* 04/26/2015 0020   HCT 26.9* 04/26/2015 0020   PLT 170 04/26/2015 0020   MCV 90.0 04/26/2015 0020   NEUTROABS 11.8* 04/26/2015 0020   LYMPHSABS 1.8 04/26/2015 0020   MONOABS 1.2* 04/26/2015 0020   EOSABS 0.1 04/26/2015 0020   BASOSABS 0.0 04/26/2015 0020   Comprehensive Metabolic Panel:    Component Value Date/Time   NA 126* 04/26/2015 0020   K 5.0 04/26/2015 0020   CL 102 04/26/2015 0020   CO2 12* 04/26/2015 0020   BUN 44* 04/26/2015 0020   CREATININE 4.62* 04/26/2015 0020   GLUCOSE 118* 04/26/2015 0020   CALCIUM 8.8* 04/26/2015 0020   AST 59* 04/26/2015 0020   ALT 41 04/26/2015 0020   ALKPHOS 238* 04/26/2015 0020   BILITOT 17.5* 04/26/2015 0020   PROT 5.8* 04/26/2015 0020   ALBUMIN 1.6* 04/26/2015 0020    Physical Exam:  Vital Signs: BP 128/68 mmHg  Pulse 95  Temp(Src) 98.3 F (36.8 C) (Oral)  Resp 20  Ht 5' 6"  (1.676 m)  Wt 61.598 kg (135 lb 12.8 oz)  BMI 21.93 kg/m2  SpO2 100% SpO2: SpO2: 100 % O2 Device: O2 Device: Not Delivered O2 Flow Rate:   Intake/output summary:  Intake/Output Summary (Last 24 hours) at 04/26/15 1558 Last data filed at 04/26/15 1318  Gross per 24 hour  Intake 2964.67 ml  Output    500 ml  Net 2464.67 ml   LBM: Last BM Date: 04/25/15 Baseline Weight: Weight: 61.598 kg (135 lb 12.8 oz) Most recent weight: Weight: 61.598 kg (135 lb 12.8 oz)  Exam Findings:   General: NAD, sleeping HEENT: Temporal muscle wasting CVS: RRR Resp: No labored breathing Abd: Soft, NT, ND Extrem: Warm, dry Neuro: Aroused briefly to voice and answers questions "yes/no"           Palliative Performance Scale: 10 %                Additional Data Reviewed: Recent Labs     04/25/15  2354  04/26/15  0020   WBC   --   14.9*  HGB  10.9*  8.8*  PLT   --   170  NA  126*  126*  BUN  60*  44*  CREATININE  4.90*  4.62*     Time In: 1500 Time Out: 1600 Time Total: 12mn  Greater than 50%  of this time was spent counseling and coordinating care related to the above assessment and plan.   Signed by:  AVinie Sill NP Palliative Medicine Team Pager # [phone](M-F 8a-5p) Team Phone # [phone](Nights/Weekends)

## 2015-04-27 DIAGNOSIS — R17 Unspecified jaundice: Secondary | ICD-10-CM | POA: Insufficient documentation

## 2015-04-27 LAB — URINE CULTURE: Culture: NO GROWTH

## 2015-04-27 MED ORDER — WHITE PETROLATUM GEL
Status: AC
  Filled 2015-04-27: qty 1

## 2015-04-27 MED ORDER — LORAZEPAM 0.5 MG PO TABS: 0.5000 mg | ORAL_TABLET | ORAL | Status: AC | PRN

## 2015-04-27 MED ORDER — MORPHINE SULFATE (CONCENTRATE) 10 MG/0.5ML PO SOLN: 5.0000 mg | ORAL | Status: DC | PRN

## 2015-04-27 MED ORDER — LORAZEPAM 0.5 MG PO TABS: 0.5000 mg | ORAL_TABLET | ORAL | Status: DC | PRN

## 2015-04-27 MED ORDER — BISACODYL 10 MG RE SUPP: 10.0000 mg | Freq: Every day | RECTAL | Status: DC | PRN

## 2015-04-27 MED ORDER — MORPHINE SULFATE (CONCENTRATE) 10 MG/0.5ML PO SOLN: 5.0000 mg | ORAL | Status: AC | PRN

## 2015-04-27 MED ORDER — LORAZEPAM 2 MG/ML PO CONC: 0.5000 mg | ORAL | Status: DC | PRN

## 2015-04-27 NOTE — Discharge Summary (Signed)
Physician Discharge Summary  Heather Harmon XNA:[id number] DOB: 10/11/1945 DOA: 04/25/2015  PCP: Marco Collie, MD  Admit date: 04/25/2015 Discharge date: 04/27/2015  Time spent: >30 minutes  Recommendations for Outpatient Follow-up:  1. Comfort care and symptoms management  2. Expecting less than 6 months of life expectancy   Discharge Diagnoses:  Principal Problem:   Failure to thrive in adult Active Problems:   Common bile duct obstruction   Acute renal failure   Cholangiocarcinoma   Palliative care encounter   Anemia   Discharge Condition: comfortable and w/o SOB.  Diet recommendation: comfort feeding  Filed Weights   04/26/15 0721  Weight: 61.598 kg (135 lb 12.8 oz)    History of present illness:  69 y.o. female who was recently admitted for obstructive jaundice and was found to have a common bile duct mass which biopsy shows as cholangiocarcinoma and patient eventually had drain placed and was discharged home. She was brought to the ER after family found that patient has been having poor appetite, poor oral intake and more lethargic.  In the ER patient also was found to have positive stool for occult blood. And labs revealed worsening metabolic acidosis with acute renal failure and hyponatremia. At this time family is aware that patient has very poor prognosis and they have requested just comfort measures.  Hospital Course:  Failure to thrive in adult Acute renal failure Metabolic acidosis Cholangiocarcinoma Common bile duct obstruction Icterus  Anemia of chronic disease with superimposed ABLA  Plan: -after discussing with family members and reviewing poor prognosis; plan is for comfort care and symptoms management. Patient has been transferred to [place] and will focus on EOL care. VSS and without active pain at discharge.    Procedures:  See below for x-ray reports   Consultations:  Palliative care   Discharge Exam: Filed Vitals:   04/27/15  1400  BP: 109/67  Pulse: 95  Temp: 98.1 F (36.7 C)  Resp: 24    General: comfortable, no active pain at this moment, VVS. Patient not eating or drinking. Answer yes or no to questions. Sleepy and with positive icterus Cardiovascular: mild tachycardia (HR 95-97), no rubs or gallops Respiratory: no wheezing, no crackles Abd: with percutaneous drain in place, soft, no distension, positive BS Neuro: sleepy but arousable; no focal deficit.  Discharge Instructions   Discharge Instructions    Discharge instructions    Complete by:  As directed   Comfort care and symptoms management expected less than 6 months survival period          Current Discharge Medication List    START taking these medications   Details  LORazepam (ATIVAN) 0.5 MG tablet Take 1 tablet (0.5 mg total) by mouth every 4 (four) hours as needed for anxiety or seizure. Qty: 30 tablet, Refills: 0    Morphine Sulfate (MORPHINE CONCENTRATE) 10 MG/0.5ML SOLN concentrated solution Take 0.25 mLs (5 mg total) by mouth every 2 (two) hours as needed for moderate pain, severe pain or shortness of breath (RR > 25, DYSPNEA and agitation). Qty: 30 mL, Refills: 0      CONTINUE these medications which have NOT CHANGED   Details  cholestyramine (QUESTRAN) 4 G packet Take 1 packet (4 g total) by mouth 2 (two) times daily. Qty: 60 each, Refills: 12    pantoprazole (PROTONIX) 40 MG tablet Take 1 tablet (40 mg total) by mouth daily at 6 (six) AM. Qty: 30 tablet, Refills: 0      STOP taking  these medications     ferrous sulfate 325 (65 FE) MG tablet      midodrine (PROAMATINE) 5 MG tablet      saccharomyces boulardii (FLORASTOR) 250 MG capsule        No Known Allergies   The results of significant diagnostics from this hospitalization (including imaging, microbiology, ancillary and laboratory) are listed below for reference.    Significant Diagnostic Studies: Dg Chest 2 View  04/26/2015   CLINICAL DATA:  Altered  mental status.  EXAM: CHEST  2 VIEW  COMPARISON:  04/15/2015  FINDINGS: There is mild unchanged right hemidiaphragm elevation. There is mild interstitial coarsening which may be chronic. There is no airspace consolidation. There is no effusion. Hilar and mediastinal contours are unremarkable unchanged. There is mild unchanged aortic tortuosity.  IMPRESSION: No active cardiopulmonary disease.   Electronically Signed   By: Andreas Newport M.D.   On: 04/26/2015 01:15   Ct Head Wo Contrast  04/26/2015   CLINICAL DATA:  Altered mental status.  Two days.  EXAM: CT HEAD WITHOUT CONTRAST  TECHNIQUE: Contiguous axial images were obtained from the base of the skull through the vertex without intravenous contrast.  COMPARISON:  None.  FINDINGS: There is no intracranial hemorrhage, mass or evidence of acute infarction. There is mild generalized atrophy. There is mild chronic microvascular ischemic change. There is no significant extra-axial fluid collection.  No acute intracranial findings are evident. No bony abnormality is evident. The visible paranasal sinuses are remarkable only for mild membrane thickening in the right maxillary sinus, incompletely imaged.  IMPRESSION: Mild generalized atrophy and chronic microvascular changes. No acute findings.   Electronically Signed   By: Andreas Newport M.D.   On: 04/26/2015 01:05   Mr Abdomen Mrcp Wo Cm  04/20/2015   CLINICAL DATA:  69 year old female with history of jaundice. 3-4 day history of shortness of breath with minimal exertion, progressively worsening. Multiple recent falls. Blood in the stool.  EXAM: MRI ABDOMEN WITHOUT CONTRAST  (INCLUDING MRCP)  TECHNIQUE: Multiplanar multisequence MR imaging of the abdomen was performed. Heavily T2-weighted images of the biliary and pancreatic ducts were obtained, and three-dimensional MRCP images were rendered by post processing.  COMPARISON:  None.  FINDINGS: Comment: Today's study is limited for assessment of visceral and/or  vascular lesions by lack of IV gadolinium. In addition, today's study is limited by considerable patient respiratory motion and respiratory variation.  Lower chest: Signal intensity in the dependent portion of the right lower lobe, concerning for area of aspiration pneumonitis or pneumonia.  Hepatobiliary: Widespread heterogeneous increased T2 signal intensity throughout the hepatic parenchyma, concerning for multifocal metastases. MRCP images are exceedingly limited by patient motion, however, no significant intrahepatic biliary ductal dilatation is appreciated at this time. A percutaneous biliary drainage catheter is noted, but poorly delineated on today's MRI examination. Large filling defect in the lumen of the gallbladder presumably reflects 1 or more large gallstones. In the region of the hepatic hilum there is a very poorly defined mass-like area which is best demonstrated on diffusion images where this lesion is high signal intensity (subsequently low signal intensity on the accompanying ADC map), concerning for large neoplasm. This is estimated to measure approximately 5.1 x 3.9 cm in size (image 22 of series 14). The inferior aspect of this mass is intimately associated with the superior aspect of the pancreatic head, and the adjacent first/second portion of the duodenum. Common bile duct is poorly demonstrated, but does not appear dilated at this time. Diffuse  increased T2 signal intensity adjacent to the portal triads, indicative of periportal edema.  Pancreas: As discussed above, the large mass-like area centered in the porta hepatis encroaches upon the superior aspect of the pancreatic head. Whether not this mass arises from the pancreatic head is uncertain on today's examination. Body and tail of the pancreas are normal in appearance on today's noncontrast MRI examination. Specifically, no ductal dilatation.  Spleen: Unremarkable.  Adrenals/Urinary Tract: Bilateral adrenal glands and bilateral kidneys  are normal in appearance.  Stomach/Bowel: As mentioned above, the large mass-like area centered in the porta hepatis is intimately associated with the first/second portion of the duodenum. Remaining portions of visualized small bowel and colon are unremarkable.  Vascular/Lymphatic: No definite aneurysm identified in the visualized abdominal vasculature. Flow void is noted within the superior mesenteric vein, splenic vein, portal vein and main portal vein branches bilaterally. Prominent soft tissue in the porta hepatis may include some nodal tissue. No other lymphadenopathy is noted elsewhere in the abdomen on today's noncontrast MRI examination.  Other: Trace volume of ascites.  Musculoskeletal: No aggressive osseous lesion identified in the visualized portions of the skeleton on today's noncontrast MRI examination.  IMPRESSION: 1. Very limited study secondary to respiratory motion and lack of IV contrast. With these limitations in mind, there is a large mass centered in the region of the porta hepatis, which likely accounts for the recently discovered biliary tract obstruction. No residual intrahepatic biliary ductal dilatation following placement of percutaneous drainage catheter. There are innumerable hepatic lesions, suggesting widespread metastatic disease to the liver. The mass in the porta hepatis is intimately associated with the superior aspect of the pancreatic head, as well as the first/second portion of the duodenum, which could indicate either primary hepatic, biliary, pancreatic or duodenal lesion (favored to be primary hepatobiliary). 2. Cholelithiasis. 3. Small volume of ascites.   Electronically Signed   By: Vinnie Langton M.D.   On: 04/20/2015 08:00   Dg Chest Port 1 View  04/15/2015   CLINICAL DATA:  Acute onset of generalized shortness of breath and weakness. Initial encounter.  EXAM: PORTABLE CHEST - 1 VIEW  COMPARISON:  None.  FINDINGS: The lungs are well-aerated. Minimal right basilar  density is thought to reflect overlying soft tissues. There is no evidence of focal opacification, pleural effusion or pneumothorax.  The cardiomediastinal silhouette is within normal limits. No acute osseous abnormalities are seen.  IMPRESSION: No acute cardiopulmonary process seen.   Electronically Signed   By: Garald Balding M.D.   On: 04/15/2015 01:51   Ir Biliary Drain Placement With Cholangiogram  04/16/2015   INDICATION: High-grade biliary obstruction with bilirubin of 42 and endoscopy demonstrating 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 ERCP directed biliary stent placement was not possible and the patient now presents for percutaneous biliary drainage.  EXAM: ULTRASOUND AND FLUOROSCOPIC GUIDED PERCUTANEOUS TRANSHEPATIC CHOLANGIOGRAM AND BILIARY DRAINAGE CATHETER PLACEMENT  COMPARISON:  None.  MEDICATIONS: 2.0 mg IV Versed sec, 100 mcg IV fentanyl. A scheduled dose of 2.25 g IV Zosyn as well as 400 mg IV Cipro were administered.  CONTRAST:  30 mL OMNIPAQUE IOHEXOL 300 MG/ML  SOLN  ANESTHESIA/SEDATION: Total Moderate Sedation Time  30 minutes  FLUOROSCOPY TIME:  4 minutes and 49 seconds.  COMPLICATIONS: None  TECHNIQUE: Informed written consent was obtained from [facility] [place] [facility] after a discussion of the risks, benefits and alternatives to treatment. Questions regarding the procedure were encouraged and answered. A timeout was performed prior to the initiation of the  procedure.  The right upper abdominal quadrant was prepped and draped in the usual sterile fashion, and a sterile drape was applied covering the operative field. Maximum barrier sterile technique with sterile gowns and gloves were used for the procedure.  Ultrasound scanning of the right upper abdominal quadrant was performed to delineate the anatomy and avoid transgression of the gallbladder or pleura. Local anesthesia was provided with 1% lidocaine. Under direct ultrasound guidance, a 21 gauge needle was advanced into a  right lobe intrahepatic duct. Contrast injection was performed and cholangiography performed of the intrahepatic biliary tree.  A guidewire was advanced through the needle and an Accustick dilator advanced into the central bile ducts. The Accustick catheter was exchanged for a Kumpe catheter over a [employer] wire. With the use of a regular glide wire, the Kumpe catheter was advanced beyond the level of the common bile duct obstruction and into the duodenum.  Over an Amplatz stiff wire, the tract was dilated and a 10.2 [place] biliary drainage catheter was advanced with coil ultimately locked within the duodenum. Contrast was injected and a completion radiograph was obtained. The catheter was connected to a drainage bag which yielded the brisk return of clear bile. The catheter was secured to the skin with an interrupted suture and StatLock device. The patient tolerated the procedure well without immediate postprocedural complication.  FINDINGS: Cholangiography demonstrates a high-grade biliary obstruction with massively dilated intrahepatic and central bile ducts present. There was beaking noted at the level of the common bile duct with significant obstruction present over the distal segment of the CBD. Based on appearance, there may be more than just an ampullary process causing biliary stricture which extends higher than expected for ampullary involvement alone.  After crossing the obstruction, the biliary drainage catheter was able to be formed in the duodenum with sideholes extending up into the central biliary tree. The catheter will be left to gravity drainage to allow bilirubin to decrease. Ultimately, after pending tissue biopsy results from endoscopic biopsies, the common bile duct stricture may be able to be treated with an internalized permanent biliary stent from current percutaneous access.  IMPRESSION: Cholangiogram confirms the presence of a high-grade biliary obstruction with obstruction at the level of  the common bile duct. As above, the stricture of the common bile duct appears to be fairly long and there may be fairly extensive tumor causing constriction of the common bile duct. 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 However, a pancreatic neoplasm may also be present and additional characterization with MRI may be helpful. A 10 French internal/external biliary drainage catheter was able to be placed percutaneously across the level of biliary obstruction and into the duodenum. This will be left to gravity drainage. The common bile duct stricture may be amenable to internalized permanent biliary stent placement once the biliary tree has been allowed to decompress and pending endoscopic biopsy results.   Electronically Signed   By: Aletta Edouard M.D.   On: 04/16/2015 18:50    Microbiology: Recent Results (from the past 240 hour(s))  Urine culture     Status: None   Collection Time: 04/26/15  1:09 AM  Result Value Ref Range Status   Specimen Description URINE, RANDOM  Final   Special Requests NONE  Final   Culture NO GROWTH 1 DAY  Final   Report Status 04/27/2015 FINAL  Final  Blood culture (routine x 2)     Status: None (Preliminary result)   Collection Time: 04/26/15  4:30 AM  Result Value Ref Range Status   Specimen Description BLOOD RIGHT ANTECUBITAL  Final   Special Requests BOTTLES DRAWN AEROBIC AND ANAEROBIC 5CC EACH  Final   Culture NO GROWTH 1 DAY  Final   Report Status PENDING  Incomplete     Labs: Basic Metabolic Panel:  Recent Labs Lab 04/25/15 2354 04/26/15 0020  NA 126* 126*  K 6.6* 5.0  CL 109 102  CO2  --  12*  GLUCOSE 107* 118*  BUN 60* 44*  CREATININE 4.90* 4.62*  CALCIUM  --  8.8*   Liver Function Tests:  Recent Labs Lab 04/26/15 0020  AST 59*  ALT 41  ALKPHOS 238*  BILITOT 17.5*  PROT 5.8*  ALBUMIN 1.6*    Recent Labs Lab 04/26/15 0345  AMMONIA 25   CBC:  Recent Labs Lab 04/21/15 0546 04/22/15 0512 04/25/15 2354 04/26/15 0020   WBC 30.5* 23.3*  --  14.9*  NEUTROABS  --   --   --  11.8*  HGB 8.8* 7.8* 10.9* 8.8*  HCT 26.6* 23.9* 32.0* 26.9*  MCV 93.0 93.4  --  90.0  PLT 194 164  --  170   BNP (last 3 results)  Recent Labs  04/15/15 0126  BNP 121.2*   CBG:  Recent Labs Lab 04/26/15 0101  GLUCAP 92    Signed:  Barton Dubois  [facility] 04/27/2015, 3:39 PM

## 2015-04-27 NOTE — Progress Notes (Signed)
[facility]  Patient Status: Active (receiving services up to time of hospitalization)  [facility] is providing the following services: RN, PT and MSW  If patient discharges after hours, please call [phone].   Consepcion Hearing 04/27/2015, 9:22 AM

## 2015-04-27 NOTE — Clinical Social Work Note (Signed)
Clinical Social Work Assessment  Patient Details  Name: Heather Harmon MRN: [id number] Date of Birth: 23-Dec-1945  Date of referral:  04/27/15               Reason for consult:  End of Life/Hospice              Housing/Transportation Living arrangements for the past 2 months:  Single Family Home Source of Information:  Other (Comment Required) Malachy Mood Gains, sister [phone]) Patient Interpreter Needed:  None Criminal Activity/Legal Involvement Pertinent to Current Situation/Hospitalization:  No - Comment as needed Significant Relationships:  None Lives with:  Siblings Do you feel safe going back to the place where you live?  No Need for family participation in patient care:  Yes (Comment)  Care giving concerns:  N/A   Social Worker assessment / plan:  CSW met the pt and the pt's sister Malachy Mood at bedside. CSW introduced self and purpose of visit. CSW and Malachy Mood discussed residential hospice. Malachy Mood expressed interested in [facility]. CSW spoke with Juliann Pulse regarding an acute hospice bed. CSW faxed clinicals over to [facility]. CSW provided [place] with contact information for further questions. CSW will continue to follow this pt and assist with discharge as needed.   Employment status:  Retired  Nurse, adult PT Recommendations:  Not assessed at this time Information / Referral to community resources:   [facility] List )  Patient/Family's Response to care:  Malachy Mood reported that the care in which the pt has received has been well.   Patient/Family's Understanding of and Emotional Response to Diagnosis, Current Treatment, and Prognosis:  Malachy Mood acknowledged the pt diagnosis and prognosis. Malachy Mood shared that she desire for the pt to go to [place], but if they can not take her she will take the pt back home with hospice.    Emotional Assessment Appearance:  Appears stated age Attitude/Demeanor/Rapport:   (Calm ) Affect (typically observed):   Calm Orientation:  Oriented to Self, Oriented to Place Alcohol / Substance use:  Not Applicable Psych involvement (Current and /or in the community):  No (Comment)  Discharge Needs  Concerns to be addressed:   None Readmission within the last 30 days:  No Current discharge risk:  None Barriers to Discharge:  No Barriers Identified   Jencarlo Bonadonna, LCSW 04/27/2015, 12:19 PM

## 2015-04-27 NOTE — Clinical Social Work Note (Signed)
CSW spoke with [place]. CSW informed Heather Harmon that the pt will be discharge today. CSW and Heather Harmon discussed ambulance transport. CSW contact [facility] at [phone] to schedule transport for the pt. CSW will faxed over the discharge summary was completed. Bedside RN can call reported to [phone].   [place], MSW, [place]

## 2015-04-27 NOTE — Progress Notes (Signed)
Report given to [facility] room [phone] at 15:06

## 2015-04-27 NOTE — Progress Notes (Signed)
Daily Progress Note   Patient Name: Heather Harmon       Date: 04/27/2015 DOB: Oct 31, 1945  Age: 69 y.o. MRN#: [id number] Attending Physician: Barton Dubois, MD Primary Care Physician: Marco Collie, MD Admit Date: 04/25/2015  Reason for Consultation/Follow-up: Establishing goals of care  Subjective:     Ms. Rahimi is a little more restless this morning and family says she was hurting and restless last night although she only received one dose of pain medication (likely could have used some ativan as well to assist with her comfort). She has not been eating or drinking anything for days. Sleeping most of the day. Prognosis is poor in hours to days. Sister says that they prefer for her to be at [facility] for end of life care. Emotional support provided to multiple family members at bedside.    Length of Stay: 1 day  Current Medications: Scheduled Meds:  . cholestyramine  4 g Oral Q12H  . ferrous sulfate  325 mg Oral Q breakfast  . pantoprazole  40 mg Oral Q0600    Continuous Infusions: . dextrose 5 % and 0.9% NaCl 50 mL/hr at 04/27/15 0154    PRN Meds: acetaminophen **OR** acetaminophen, LORazepam, morphine CONCENTRATE, ondansetron **OR** ondansetron (ZOFRAN) IV  Palliative Performance Scale: 10%     Vital Signs: BP 100/56 mmHg  Pulse 100  Temp(Src) 98.4 F (36.9 C) (Oral)  Resp 16  Ht 5\' 6"  (1.676 m)  Wt 61.598 kg (135 lb 12.8 oz)  BMI 21.93 kg/m2  SpO2 99% SpO2: SpO2: 99 % O2 Device: O2 Device: Not Delivered O2 Flow Rate:    Intake/output summary:  Intake/Output Summary (Last 24 hours) at 04/27/15 2440 Last data filed at 04/27/15 1027  Gross per 24 hour  Intake 1536.33 ml  Output    450 ml  Net 1086.33 ml   LBM: Last BM Date: 04/26/15 Baseline Weight: Weight: 61.598 kg (135 lb 12.8 oz) Most recent weight: Weight: 61.598 kg (135 lb 12.8 oz)  Physical Exam: General: NAD, sleeping, cachectic, jaundice  HEENT: Temporal muscle wasting CVS: RRR Resp:  No labored breathing Abd: Soft, NT, ND Extrem: Warm, dry Neuro: Aroused briefly to voice (less so than yesterday) and answers questions "yes/no" inconsistently (voice weaker than yesterday)   Additional Data Reviewed: Recent Labs     04/25/15  2354  04/26/15  0020  WBC   --   14.9*  HGB  10.9*  8.8*  PLT   --   170  NA  126*  126*  BUN  60*  44*  CREATININE  4.90*  4.62*     Problem List:  Patient Active Problem List   Diagnosis Date Noted  . Altered mental status 04/26/2015  . Failure to thrive in adult 04/26/2015  . Anemia 04/26/2015  . Generalized weakness   . Cholangiocarcinoma   . Palliative care encounter   . Jaundice   . Biliary tract obstruction   . Common bile duct obstruction 04/15/2015  . Intraabdominal mass 04/15/2015  . Severe sepsis 04/15/2015  . UTI (lower urinary tract infection) 04/15/2015  . Acute renal failure 04/15/2015  . Hypokalemia 04/15/2015  . Hypomagnesemia 04/15/2015     Palliative Care Assessment & Plan    Code Status:  DNR  Goals of Care:  Comfort care.   Hopeful for hospice home.   3. Symptom Management:  Pain/dyspnea: Roxanol prn.  Anxiety/agitation: Ativan prn.   4. Palliative Prophylaxis:  Stool [place]: At EOL and not eating. Will  not be aggressive with bowel regimen unless needed. Will add prn suppository.  5. Prognosis: Hours - Days  5. Discharge Planning: Hospice facility   Care plan was discussed with family and CSW.   Thank you for allowing the Palliative Medicine Team to assist in the care of this patient.   Time In: 0945 Time Out: 1005 Total Time 30min Prolonged Time Billed  no     Greater than 50%  of this time was spent counseling and coordinating care related to the above assessment and plan.     Vinie Sill, NP Palliative Medicine Team Pager # [phone] (M-F 8a-5p) Team Phone # [phone] (Nights/Weekends)  04/27/2015, 9:52 AM

## 2015-05-01 LAB — CULTURE, BLOOD (ROUTINE X 2): CULTURE: NO GROWTH

## 2015-05-04 ENCOUNTER — Other Ambulatory Visit: Payer: Commercial Managed Care - HMO

## 2015-05-27 DEATH — deceased

## 2016-09-17 IMAGING — CR DG CHEST 1V PORT
1 series · 1 of 1 positions shown · non-contrast
Comparison: None.

CLINICAL DATA: Acute onset of generalized shortness of breath and
weakness. Initial encounter.

EXAM:
PORTABLE CHEST - 1 VIEW

[AP]
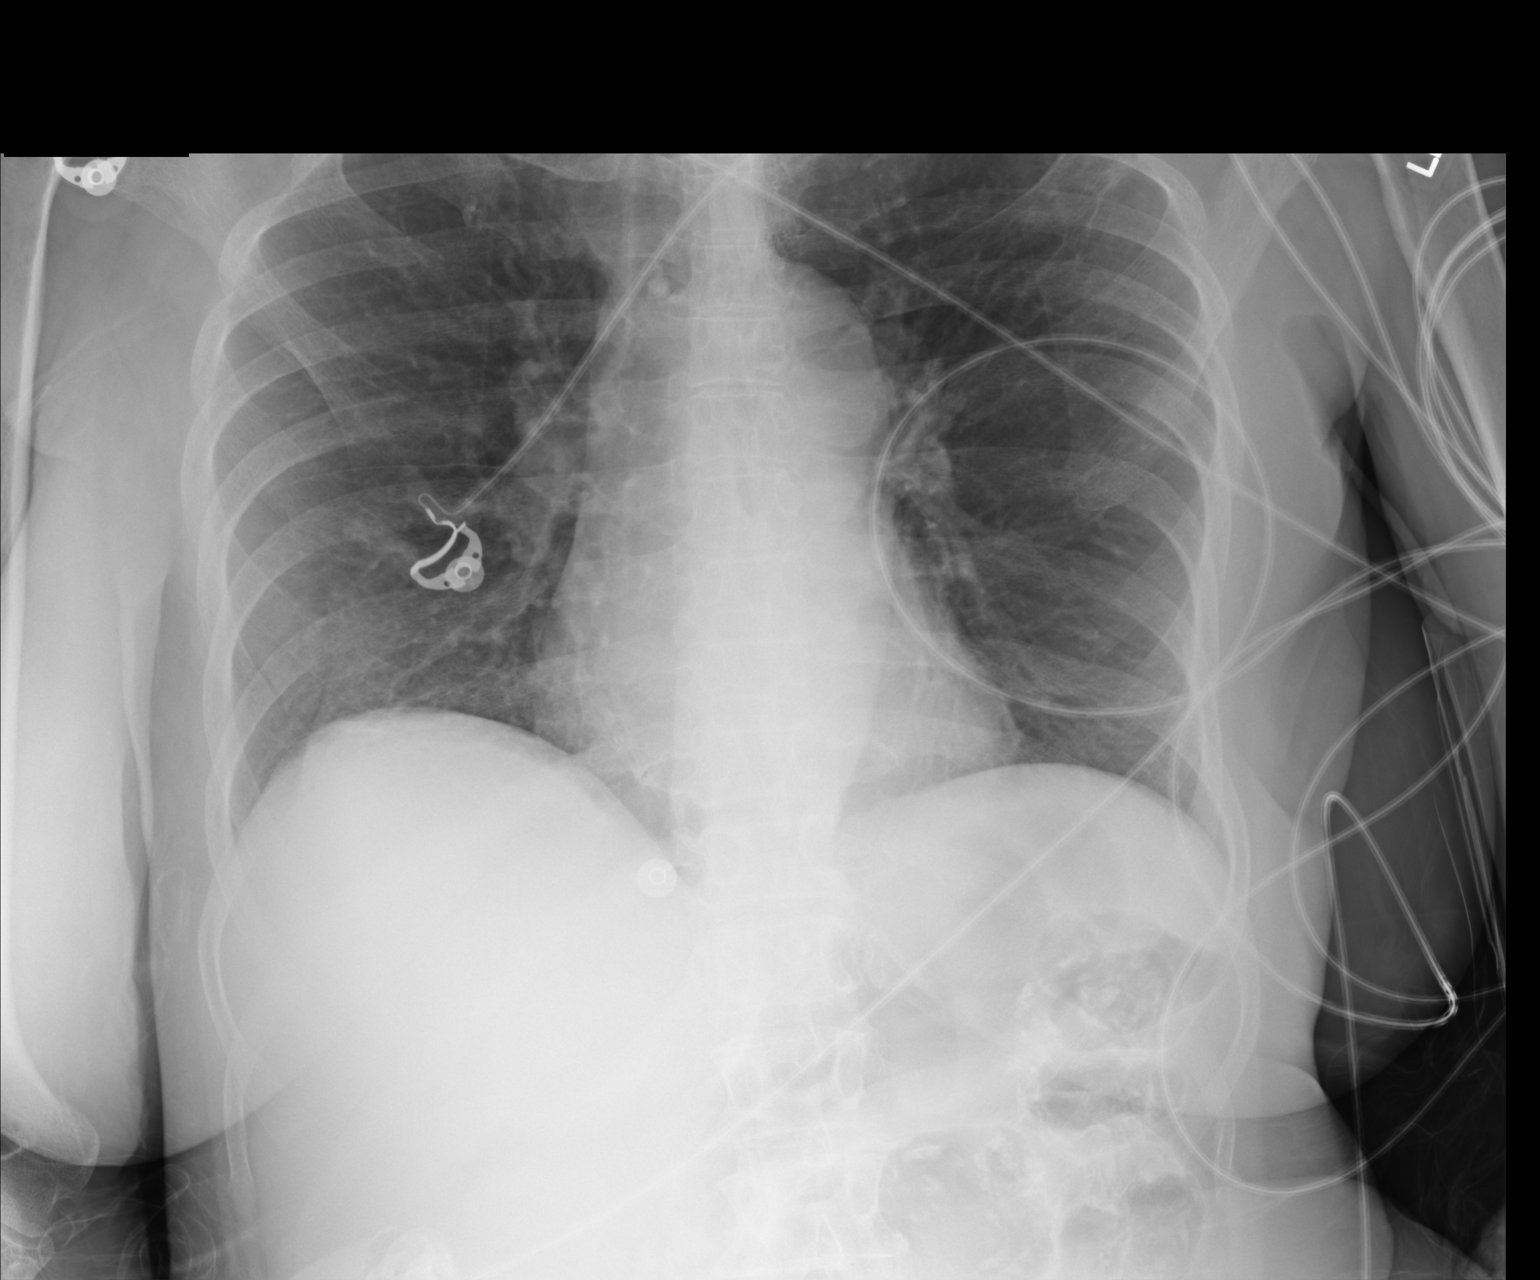

[1 of 1 positions shown; findings below may reference images not displayed]

FINDINGS: The lungs are well-aerated. Minimal right basilar density is thought
to reflect overlying soft tissues. There is no evidence of focal
opacification, pleural effusion or pneumothorax.

The cardiomediastinal silhouette is within normal limits. No acute
osseous abnormalities are seen.
IMPRESSION: No acute cardiopulmonary process seen.

## 2016-09-18 IMAGING — US IR BILIARY DRAIN PLACEMENT W/ CHOLANGIOGRAM
1 series · 1 of 1 positions shown · non-contrast
Comparison: None.

INDICATION: High-grade biliary obstruction with bilirubin of 42 and endoscopy
demonstrating ulcerated periampullary duodenal mass obscuring the
ampulla. ERCP directed biliary stent placement was not possible and
the patient now presents for percutaneous biliary drainage.

EXAM:
ULTRASOUND AND FLUOROSCOPIC GUIDED PERCUTANEOUS TRANSHEPATIC
CHOLANGIOGRAM AND BILIARY DRAINAGE CATHETER PLACEMENT
TECHNIQUE: Informed written consent was obtained from NAH, FRDA after a
discussion of the risks, benefits and alternatives to treatment.
Questions regarding the procedure were encouraged and answered. A
timeout was performed prior to the initiation of the procedure.

[Series 1: ir (id) (id)/(id) · 1 of 1 slices shown]
[im 1/1]
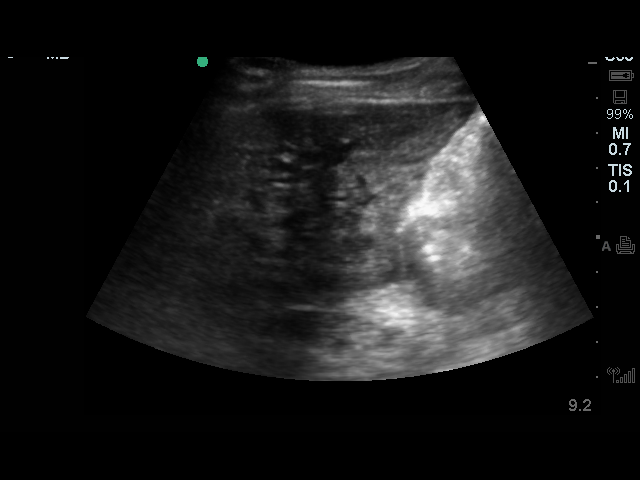

[1 of 1 positions shown; findings below may reference images not displayed]

MEDICATIONS:
2.0 mg IV Versed sec, 100 mcg IV fentanyl. A scheduled dose of
g IV Zosyn as well as 400 mg IV Cipro were administered.

CONTRAST:  30 mL OMNIPAQUE IOHEXOL 300 MG/ML  SOLN

ANESTHESIA/SEDATION:
Total Moderate Sedation Time

30 minutes

FLUOROSCOPY TIME:  4 minutes and 49 seconds.

COMPLICATIONS:
None
The right upper abdominal quadrant was prepped and draped in the
usual sterile fashion, and a sterile drape was applied covering the
operative field. Maximum barrier sterile technique with sterile
gowns and gloves were used for the procedure.

Ultrasound scanning of the right upper abdominal quadrant was
performed to delineate the anatomy and avoid transgression of the
gallbladder or pleura. Local anesthesia was provided with 1%
lidocaine. Under direct ultrasound guidance, a 21 gauge needle was
advanced into a right lobe intrahepatic duct. Contrast injection was
performed and cholangiography performed of the intrahepatic biliary
tree.

A guidewire was advanced through the needle and an Accustick dilator
advanced into the central bile ducts. The Accustick catheter was
exchanged for a Kumpe catheter over a Benson wire. With the use of a
regular glide wire, the Kumpe catheter was advanced beyond the level
of the common bile duct obstruction and into the duodenum.

Over an Amplatz stiff wire, the tract was dilated and a 10.2 French
biliary drainage catheter was advanced with coil ultimately locked
within the duodenum. Contrast was injected and a completion
radiograph was obtained. The catheter was connected to a drainage
bag which yielded the brisk return of clear bile. The catheter was
secured to the skin with an interrupted suture and StatLock device.
The patient tolerated the procedure well without immediate
postprocedural complication.
FINDINGS: Cholangiography demonstrates a high-grade biliary obstruction with
massively dilated intrahepatic and central bile ducts present. There
was beaking noted at the level of the common bile duct with
significant obstruction present over the distal segment of the CBD.
Based on appearance, there may be more than just an ampullary
process causing biliary stricture which extends higher than expected
for ampullary involvement alone.

After crossing the obstruction, the biliary drainage catheter was
able to be formed in the duodenum with sideholes extending up into
the central biliary tree. The catheter will be left to gravity
drainage to allow bilirubin to decrease. Ultimately, after pending
tissue biopsy results from endoscopic biopsies, the common bile duct
stricture may be able to be treated with an internalized permanent
biliary stent from current percutaneous access.
IMPRESSION: Cholangiogram confirms the presence of a high-grade biliary
obstruction with obstruction at the level of the common bile duct.
As above, the stricture of the common bile duct appears to be fairly
long and there may be fairly extensive tumor causing constriction of
the common bile duct. This could be from an ampullary carcinoma.
However, a pancreatic neoplasm may also be present and additional
characterization with MRI may be helpful. A 10 French
internal/external biliary drainage catheter was able to be placed
percutaneously across the level of biliary obstruction and into the
duodenum. This will be left to gravity drainage. The common bile
duct stricture may be amenable to internalized permanent biliary
stent placement once the biliary tree has been allowed to decompress
and pending endoscopic biopsy results.

## 2016-09-28 IMAGING — CR DG CHEST 2V
2 series · 2 of 2 positions shown · non-contrast
Comparison: 04/15/2015

CLINICAL DATA: Altered mental status.

EXAM:
CHEST  2 VIEW

[chest lat]
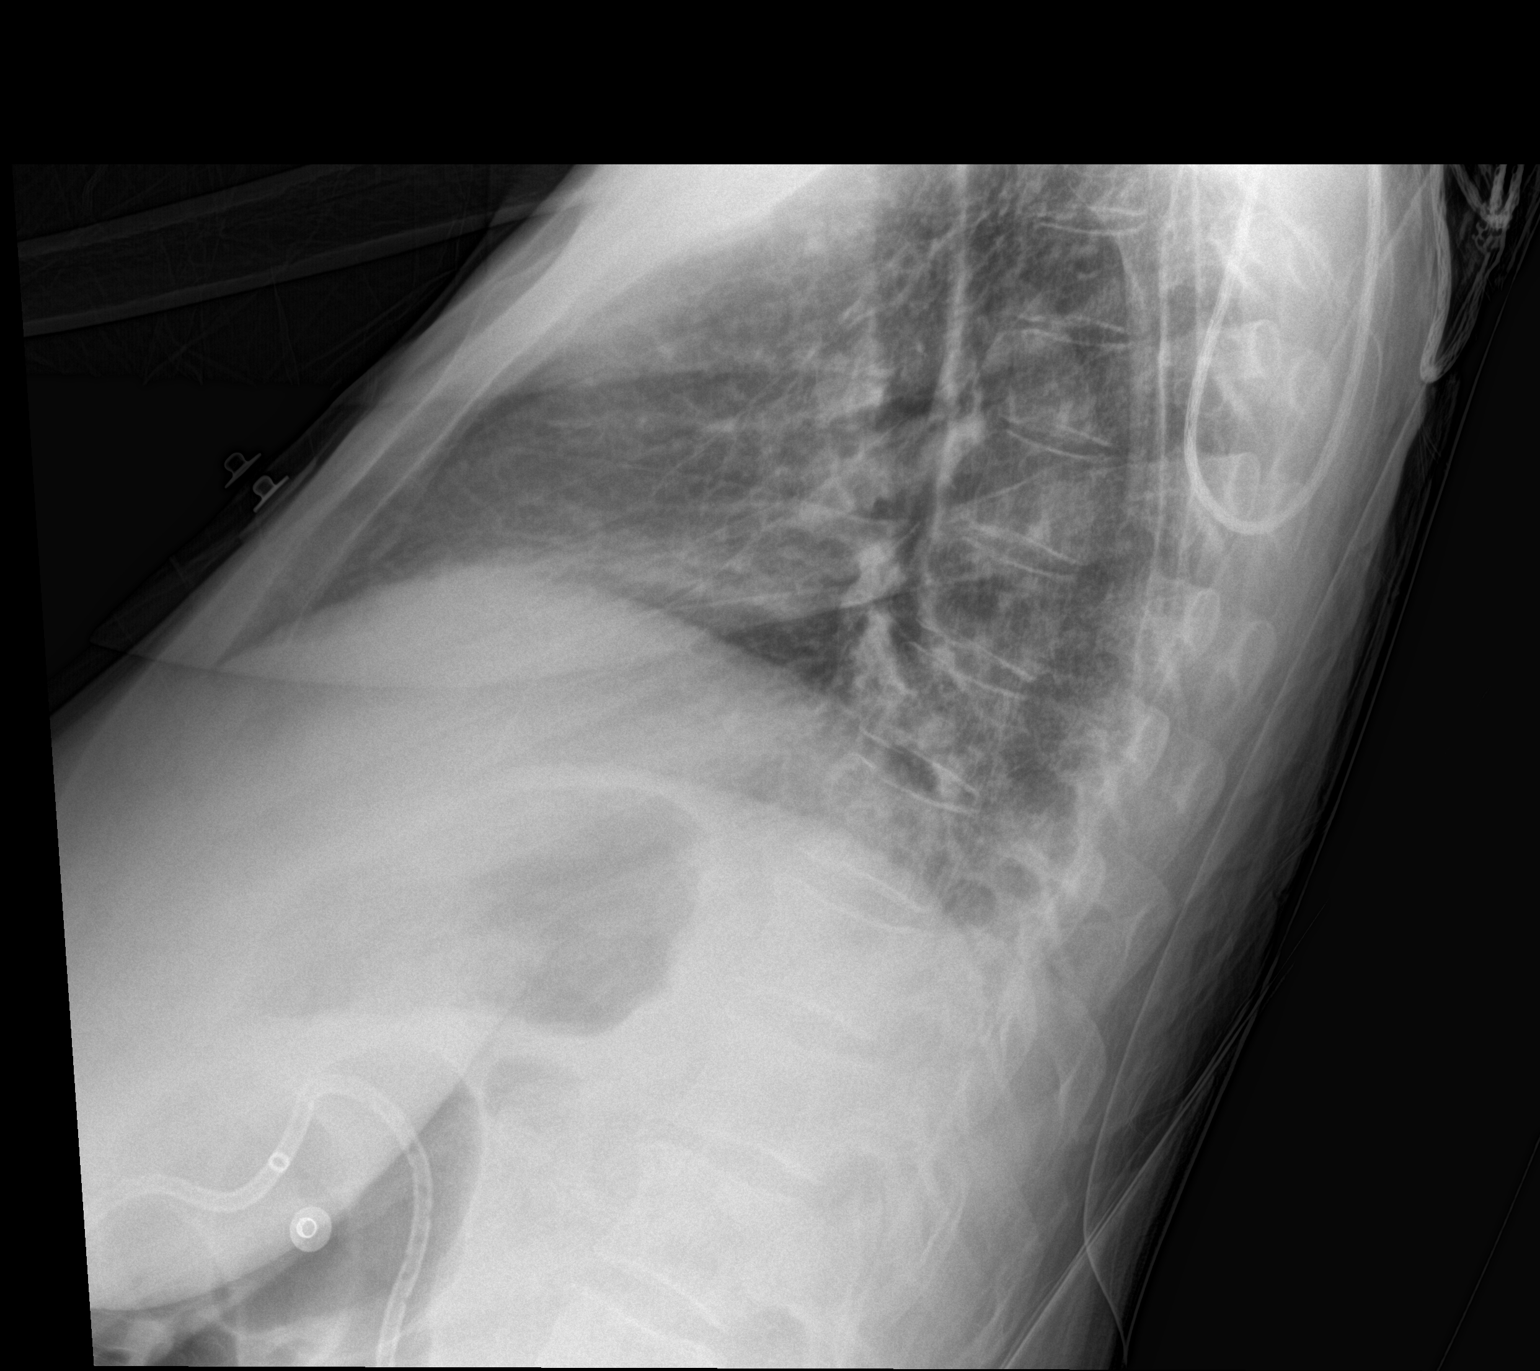

[chest ap]
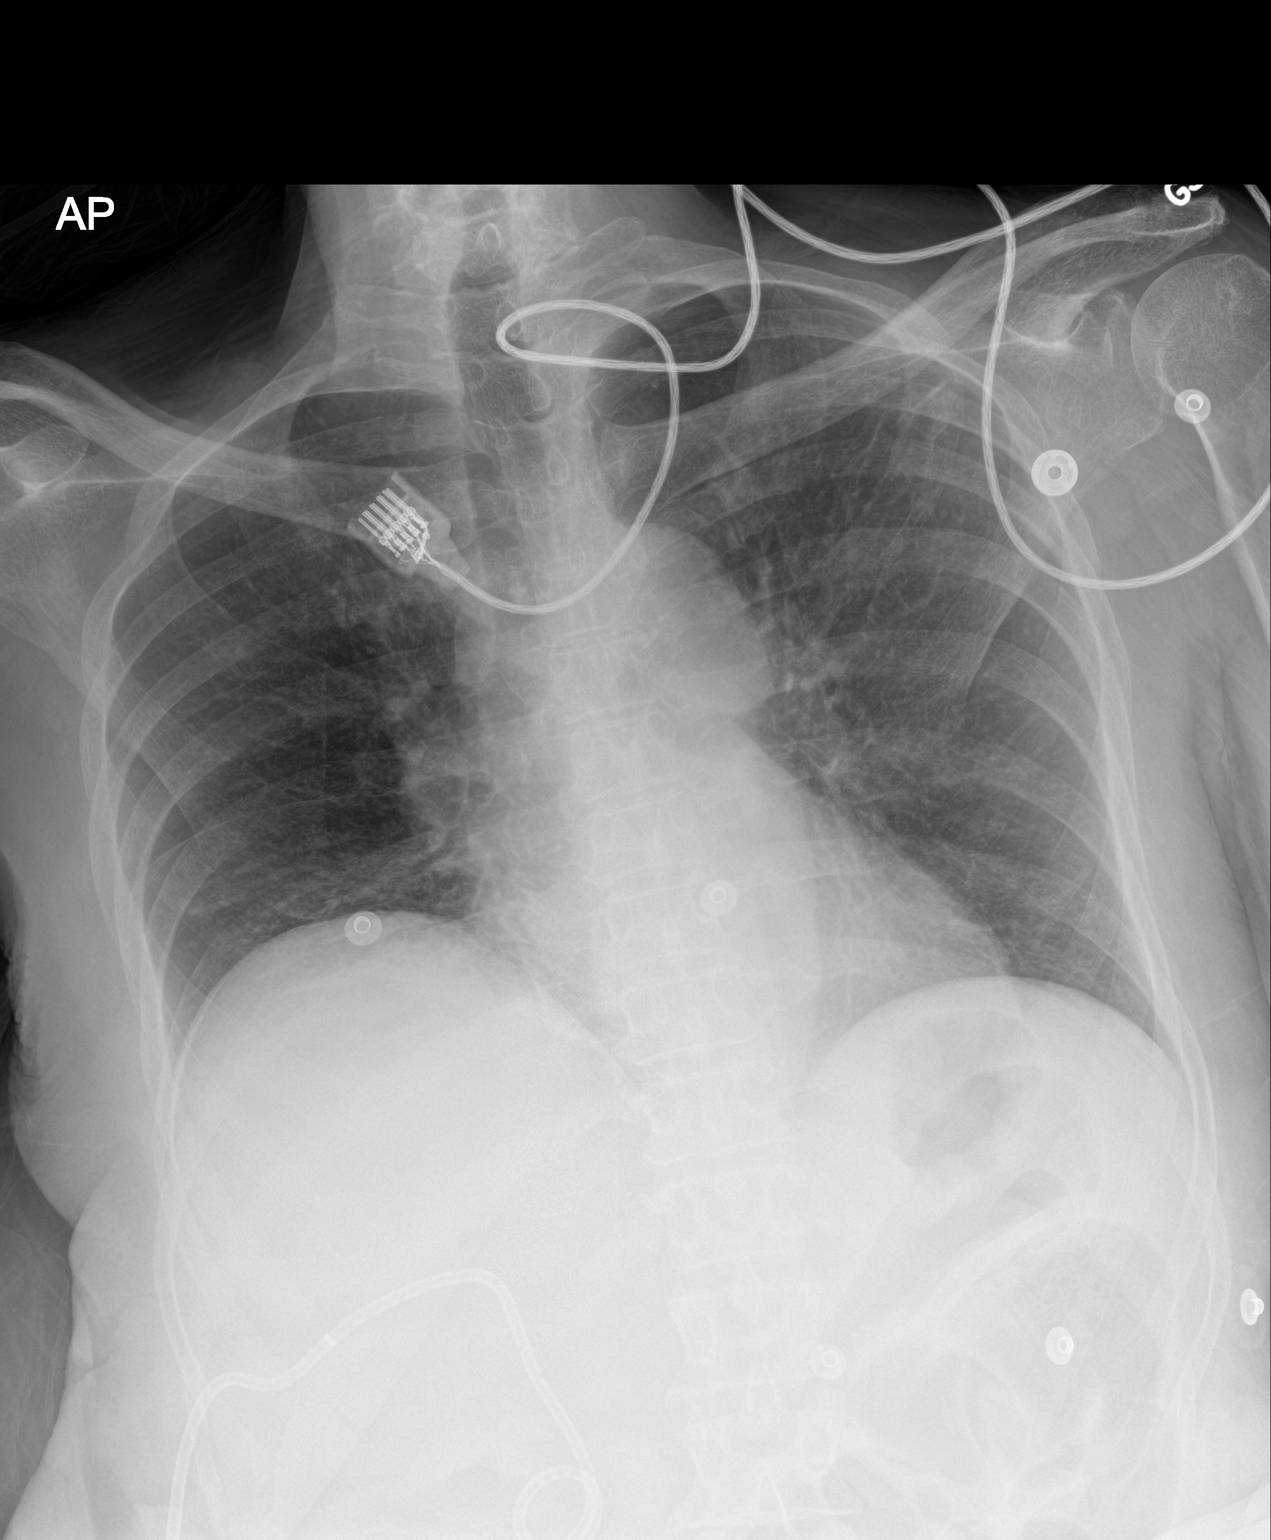

[2 of 2 positions shown; findings below may reference images not displayed]

FINDINGS: There is mild unchanged right hemidiaphragm elevation. There is mild
interstitial coarsening which may be chronic. There is no airspace
consolidation. There is no effusion. Hilar and mediastinal contours
are unremarkable unchanged. There is mild unchanged aortic
tortuosity.
IMPRESSION: No active cardiopulmonary disease.
